# Patient Record
Sex: Female | Born: 1990 | Race: Black or African American | Hispanic: No | Marital: Single | Smoking: Never smoker
Health system: Southern US, Community
[De-identification: ages and names within clinical notes are randomized; demographics above are authoritative.]

## PROBLEM LIST (undated history)

## (undated) DIAGNOSIS — G2581 Restless legs syndrome: Secondary | ICD-10-CM

## (undated) DIAGNOSIS — J45909 Unspecified asthma, uncomplicated: Secondary | ICD-10-CM

## (undated) DIAGNOSIS — O009 Unspecified ectopic pregnancy without intrauterine pregnancy: Secondary | ICD-10-CM

## (undated) DIAGNOSIS — O99019 Anemia complicating pregnancy, unspecified trimester: Secondary | ICD-10-CM

## (undated) DIAGNOSIS — R102 Pelvic and perineal pain: Secondary | ICD-10-CM

## (undated) DIAGNOSIS — R87619 Unspecified abnormal cytological findings in specimens from cervix uteri: Secondary | ICD-10-CM

## (undated) HISTORY — DX: Unspecified abnormal cytological findings in specimens from cervix uteri: R87.619

## (undated) HISTORY — DX: Pelvic and perineal pain: R10.2

## (undated) HISTORY — DX: Anemia complicating pregnancy, unspecified trimester: O99.019

## (undated) HISTORY — PX: HERNIA REPAIR: SHX51

## (undated) HISTORY — DX: Restless legs syndrome: G25.81

---
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
## 2010-05-28 ENCOUNTER — Ambulatory Visit: Payer: Self-pay | Admitting: Unknown Physician Specialty

## 2010-08-31 ENCOUNTER — Ambulatory Visit: Payer: Self-pay | Admitting: Internal Medicine

## 2010-12-15 NOTE — Op Note (Signed)
NAMEVONCILE, Emily Levy            ACCOUNT NO.:  [id number]   MEDICAL RECORD NO.:  [id number]          PATIENT TYPE:  AMB   LOCATION:  SDS                          FACILITY:  [facility]   PHYSICIAN:  Jon Gills, M.D.  DATE OF BIRTH:  1990-11-20   DATE OF PROCEDURE:  03/06/2007  DATE OF DISCHARGE:  02/07/2007                               OPERATIVE REPORT   PREOPERATIVE DIAGNOSIS:  Abdominal pain.   POSTOPERATIVE DIAGNOSIS:  Abdominal pain.   OPERATION:  Upper GI endoscopy with biopsy.   SURGEON:  Jon Gills, MD   ASSISTANT:  None.   DESCRIPTION OF FINDINGS:  Following informed written consent, the  patient was taken to the operating room and placed under general  anesthesia with continuous cardiopulmonary monitoring.  She was placed  under general anesthesia with continuous cardiopulmonary monitoring.  She remained in the supine position and the Pentax upper GI endoscope  was passed by mouth and advanced without difficulty.  A competent lower  esophageal sphincter was present 40 cm from the incisors.  There was no  visual evidence for esophagitis, gastritis, duodenitis or peptic ulcer  disease.  A solitary gastric biopsy was negative for Helicobacter by CLO  testing.  Multiple esophageal, gastric and duodenal biopsies were  unremarkable except for mild chronic gastritis.  The endoscope was  gradually withdrawn.  The patient was awakened, taken recovery room in  satisfactory condition.  She will be released later today to the care of  her family.   DESCRIPTION OF TECHNICAL PROCEDURE USED:  Pentax upper GI endoscope with  cold biopsy forceps.   DESCRIPTION OF SPECIMENS REMOVED:  Esophagus x3 in formalin, gastric x1  for CLO testing, gastric x3 in formalin and duodenum x3 in formalin.           ______________________________  Jon Gills, M.D.     JHC/MEDQ  D:  03/06/2007  T:  03/07/2007  Job:  [id number]   cc:   Serita Grit

## 2011-05-15 LAB — CBC
Hemoglobin: 11.8 — ABNORMAL LOW
MCHC: 33.2
RBC: 4.26
WBC: 5.8

## 2011-05-15 LAB — CLOTEST (H. PYLORI), BIOPSY: Helicobacter screen: NEGATIVE

## 2012-05-05 ENCOUNTER — Emergency Department: Payer: Self-pay | Admitting: Emergency Medicine

## 2012-11-07 ENCOUNTER — Emergency Department: Payer: Self-pay | Admitting: Emergency Medicine

## 2012-11-07 LAB — URINALYSIS, COMPLETE
Bilirubin,UR: NEGATIVE
Blood: NEGATIVE
Ketone: NEGATIVE
Nitrite: NEGATIVE
Ph: 5 (ref 4.5–8.0)
Protein: NEGATIVE
Squamous Epithelial: 1
WBC UR: 1 /HPF (ref 0–5)

## 2012-11-07 LAB — COMPREHENSIVE METABOLIC PANEL
Albumin: 3.9 g/dL (ref 3.4–5.0)
BUN: 9 mg/dL (ref 7–18)
Bilirubin,Total: 0.5 mg/dL (ref 0.2–1.0)
Calcium, Total: 8.7 mg/dL (ref 8.5–10.1)
Co2: 23 mmol/L (ref 21–32)
Creatinine: 0.89 mg/dL (ref 0.60–1.30)
EGFR (African American): >60
EGFR (Non-African Amer.): >60
Osmolality: 279 (ref 275–301)
SGOT(AST): 29 U/L (ref 15–37)
SGPT (ALT): 20 U/L (ref 12–78)
Total Protein: 7.5 g/dL (ref 6.4–8.2)

## 2012-11-07 LAB — CBC
HCT: 37.3 % (ref 35.0–47.0)
HGB: 12.3 g/dL (ref 12.0–16.0)
MCHC: 33 g/dL (ref 32.0–36.0)
Platelet: 275 10*3/uL (ref 150–440)
RBC: 4.55 10*6/uL (ref 3.80–5.20)
RDW: 14.4 % (ref 11.5–14.5)

## 2012-12-10 ENCOUNTER — Inpatient Hospital Stay: Payer: Self-pay | Admitting: Surgery

## 2013-10-27 ENCOUNTER — Ambulatory Visit: Payer: Self-pay | Admitting: Emergency Medicine

## 2014-02-01 ENCOUNTER — Ambulatory Visit: Payer: Self-pay | Admitting: Physician Assistant

## 2014-03-17 ENCOUNTER — Ambulatory Visit: Payer: Self-pay | Admitting: Physician Assistant

## 2014-03-17 LAB — URINALYSIS, COMPLETE
BILIRUBIN, UR: NEGATIVE
Bacteria: NEGATIVE
Blood: NEGATIVE
GLUCOSE, UR: NEGATIVE
Ketone: NEGATIVE
LEUKOCYTE ESTERASE: NEGATIVE
Nitrite: NEGATIVE
PH: 7 (ref 5.0–8.0)
PROTEIN: NEGATIVE
SPECIFIC GRAVITY: 1.01 (ref 1.000–1.030)

## 2014-03-17 LAB — CBC WITH DIFFERENTIAL/PLATELET
BASOS ABS: 0.1 10*3/uL (ref 0.0–0.1)
BASOS PCT: 0.6 %
EOS ABS: 0.1 10*3/uL (ref 0.0–0.7)
Eosinophil %: 1.2 %
HCT: 37.5 % (ref 35.0–47.0)
HGB: 11.5 g/dL — AB (ref 12.0–16.0)
LYMPHS ABS: 2.8 10*3/uL (ref 1.0–3.6)
LYMPHS PCT: 31.5 %
MCH: 23.9 pg — AB (ref 26.0–34.0)
MCHC: 30.7 g/dL — AB (ref 32.0–36.0)
MCV: 78 fL — AB (ref 80–100)
MONO ABS: 0.7 x10 3/mm (ref 0.2–0.9)
MONOS PCT: 8 %
NEUTROS ABS: 5.3 10*3/uL (ref 1.4–6.5)
NEUTROS PCT: 58.7 %
PLATELETS: 318 10*3/uL (ref 150–440)
RBC: 4.82 10*6/uL (ref 3.80–5.20)
RDW: 14.8 % — ABNORMAL HIGH (ref 11.5–14.5)
WBC: 9 10*3/uL (ref 3.6–11.0)

## 2014-03-17 LAB — PREGNANCY, URINE: Pregnancy Test, Urine: NEGATIVE m[IU]/mL

## 2014-04-20 ENCOUNTER — Emergency Department: Payer: Self-pay | Admitting: Emergency Medicine

## 2014-06-07 ENCOUNTER — Emergency Department: Payer: Self-pay | Admitting: Emergency Medicine

## 2014-06-24 ENCOUNTER — Emergency Department: Payer: Self-pay | Admitting: Emergency Medicine

## 2014-07-30 HISTORY — PX: APPENDECTOMY: SHX54

## 2014-09-10 ENCOUNTER — Emergency Department: Payer: Self-pay | Admitting: Emergency Medicine

## 2014-10-13 ENCOUNTER — Emergency Department: Payer: Self-pay | Admitting: Emergency Medicine

## 2014-11-19 NOTE — Op Note (Signed)
PATIENT NAME:  Emily Levy, POLSKY MR#:  [id number] DATE OF BIRTH:  1991/05/12  DATE OF PROCEDURE:  12/09/2012  PREOPERATIVE DIAGNOSIS: Umbilical hernia.   POSTOPERATIVE DIAGNOSIS: Umbilical hernia, 2 x 2 centimeters.   PROCEDURE PERFORMED: Umbilical hernia repair with Ventralex Patch, approximately 6 cm.   SURGEON: Jurni Cesaro A. Anvi Mangal, MD   ESTIMATED BLOOD LOSS: 10 mL.    COMPLICATIONS: None.   ANESTHESIA: General.   INDICATION FOR SURGERY: [name] is a pleasant 24 year old female who presented with a tender periumbilical hernia. I offered her repair, and thus she was brought into the operating room  for repair of umbilical hernia.   DETAILS OF PROCEDURE: Informed consent was obtained from [name]. She was brought to the operating room suite. She was laid supine on the operating room table. She was induced. An endotracheal tube was placed.  General anesthesia was administered. Her abdomen was then prepped and draped in standard surgical fashion. A timeout was then performed correctly identifying the patient name, operative site and procedure to be performed. An infraumbilical incision was made. This was deepened down to the fascia. The fascia was encountered, and I proceeded to put a Kelly clamp around her umbilical stump.  Once I was around, I transected the umbilical stump, and the defect was well dissected out.  I then cleared off the surrounding fascia and measured the defect to be 2 x 2 cm. I then used a medium Ventralex Patch which I placed underneath and incorporated into the closure. I used 0 Ethibond interrupted transverse-oriented  sutures taking a bite out of  the tails of the Ventralex Patch. There were approximately 8 sutures necessary. These were all tied at the end after all were placed. The closure was very secure, and the mesh was incorporated while the umbilicus was then tacked to the repair using two 4-0 Vicryl interrupted sutures tacking the underside of the  umbilicus to the repair. The wound was then irrigated.  It was then closed with a deep dermal interrupted 3-0 Vicryl suture and closed with a subcuticular 4-0 Monocryl. Steri-Strips, Telfa gauze and Tegaderm were then placed over the wound. The patient was then awoken, extubated and brought to the postanesthesia care unit. There were no immediate complications. Needle, sponge, and instrument counts were correct at the end of the procedure.   ____________________________ Glena Norfolk. Leolia Vinzant, MD cal:cb D: 12/09/2012 20:16:54 ET T: 12/09/2012 20:41:41 ET JOB#: [id number]  cc: Harrell Gave A. Ailey Wessling, MD, <Dictator> Floyde Parkins MD ELECTRONICALLY SIGNED 12/10/2012 15:39

## 2015-01-01 ENCOUNTER — Emergency Department
Admission: EM | Admit: 2015-01-01 | Discharge: 2015-01-01 | Disposition: A | Discharge status: Home / Self Care | Payer: Self-pay | Attending: Emergency Medicine | Admitting: Emergency Medicine

## 2015-01-01 ENCOUNTER — Encounter: Payer: Self-pay | Admitting: *Deleted

## 2015-01-01 DIAGNOSIS — D1721 Benign lipomatous neoplasm of skin and subcutaneous tissue of right arm: Secondary | ICD-10-CM | POA: Insufficient documentation

## 2015-01-01 DIAGNOSIS — S40861A Insect bite (nonvenomous) of right upper arm, initial encounter: Secondary | ICD-10-CM | POA: Insufficient documentation

## 2015-01-01 DIAGNOSIS — Y998 Other external cause status: Secondary | ICD-10-CM | POA: Insufficient documentation

## 2015-01-01 DIAGNOSIS — S70362A Insect bite (nonvenomous), left thigh, initial encounter: Secondary | ICD-10-CM | POA: Insufficient documentation

## 2015-01-01 DIAGNOSIS — S30860A Insect bite (nonvenomous) of lower back and pelvis, initial encounter: Secondary | ICD-10-CM | POA: Insufficient documentation

## 2015-01-01 DIAGNOSIS — S70361A Insect bite (nonvenomous), right thigh, initial encounter: Secondary | ICD-10-CM | POA: Insufficient documentation

## 2015-01-01 DIAGNOSIS — Y9389 Activity, other specified: Secondary | ICD-10-CM | POA: Insufficient documentation

## 2015-01-01 DIAGNOSIS — Y9289 Other specified places as the place of occurrence of the external cause: Secondary | ICD-10-CM | POA: Insufficient documentation

## 2015-01-01 DIAGNOSIS — D1720 Benign lipomatous neoplasm of skin and subcutaneous tissue of unspecified limb: Secondary | ICD-10-CM

## 2015-01-01 DIAGNOSIS — W57XXXA Bitten or stung by nonvenomous insect and other nonvenomous arthropods, initial encounter: Secondary | ICD-10-CM | POA: Insufficient documentation

## 2015-01-01 MED ORDER — HYDROXYZINE HCL 50 MG PO TABS
ORAL_TABLET | ORAL | Status: AC
  Administered 2015-01-01: 50 mg via ORAL
  Filled 2015-01-01: qty 1

## 2015-01-01 MED ORDER — DEXAMETHASONE SODIUM PHOSPHATE 10 MG/ML IJ SOLN
INTRAMUSCULAR | Status: AC
  Administered 2015-01-01: 10 mg via INTRAMUSCULAR
  Filled 2015-01-01: qty 1

## 2015-01-01 MED ORDER — HYDROXYZINE HCL 50 MG PO TABS: 50.0000 mg | ORAL_TABLET | Freq: Three times a day (TID) | ORAL | Status: DC | PRN

## 2015-01-01 MED ORDER — HYDROXYZINE HCL 50 MG PO TABS
50.0000 mg | ORAL_TABLET | Freq: Once | ORAL | Status: AC
  Administered 2015-01-01: 50 mg via ORAL

## 2015-01-01 MED ORDER — METHYLPREDNISOLONE 4 MG PO TBPK: ORAL_TABLET | ORAL | Status: DC

## 2015-01-01 MED ORDER — DEXAMETHASONE SODIUM PHOSPHATE 10 MG/ML IJ SOLN
10.0000 mg | Freq: Once | INTRAMUSCULAR | Status: AC
  Administered 2015-01-01: 10 mg via INTRAMUSCULAR

## 2015-01-01 NOTE — ED Notes (Signed)
Pt presents w/ multiple insect bites, states they are from mosquitoes. Pt denies relief from topical OTC itching medication.

## 2015-01-01 NOTE — ED Notes (Signed)
Patient states she has bites on her right arm, buttocks and back of her thighs.  She also has a sore area on right upper near [facility] that is hard and painful to touch but no redness noted. Benadryl itch cream used and PO Benadryl at 7am this morning. No relief.  States she thinks the mosquito was in the house, denies being outside in wooded areas.

## 2015-01-01 NOTE — ED Provider Notes (Signed)
[facility] Emergency Department Provider Note ___________________________________________  Time seen: Approximately 8:40 PM  I have reviewed the triage vital signs and the nursing notes.   HISTORY  Chief Complaint Insect Bite    HPI Emily Levy is a 24 y.o. female and plan a multiple insect bites to the right arm buttocks and back thighs. He is also concern about a nausea lesion is. Antecubital area of the right arm. Patient denies any fevers chills associated with thisand signs of secondary infection. Patient's the incident occurred the day of visit insect bites. She denies any nausea vomiting diarrhea. Denies any other reactions to insect bites. Patient denies pain with see her discomfort as a 6/10.   History reviewed. No pertinent past medical history.  There are no active problems to display for this patient.   Past Surgical History  Procedure Laterality Date  . Hernia repair      Current Outpatient Rx  Name  Route  Sig  Dispense  Refill  . pramipexole (MIRAPEX) 0.25 MG tablet   Oral   Take 0.25 mg by mouth 3 (three) times daily.         . hydrOXYzine (ATARAX/VISTARIL) 50 MG tablet   Oral   Take 1 tablet (50 mg total) by mouth 3 (three) times daily as needed.   30 tablet   0   . methylPREDNISolone (MEDROL DOSEPAK) 4 MG TBPK tablet      Take Tapered dose as directed   21 tablet   0     Allergies Sulfa antibiotics  History reviewed. No pertinent family history.  Social History History  Substance Use Topics  . Smoking status: Never Smoker   . Smokeless tobacco: Never Used  . Alcohol Use: No    Review of Systems Constitutional: No fever/chills Eyes: No visual changes. ENT: No sore throat. Cardiovascular: Denies chest pain. Respiratory: Denies shortness of breath. Gastrointestinal: No abdominal pain.  No nausea, no vomiting.  No diarrhea.  No constipation. Genitourinary: Negative for dysuria. Musculoskeletal:  Negative for back pain. Skin: Rash secondary to insect bites. Nausea lesion right upper arm. Neurological: Negative for headaches, focal weakness or numbness.  10-point ROS otherwise negative.  ____________________________________________   PHYSICAL EXAM:  VITAL SIGNS: ED Triage Vitals  Enc Vitals Group     BP 01/01/15 1943 122/78 mmHg     Pulse Rate 01/01/15 1943 91     Resp 01/01/15 1943 20     Temp 01/01/15 1943 98.4 F (36.9 C)     Temp Source 01/01/15 1943 Oral     SpO2 01/01/15 1943 100 %     Weight 01/01/15 1943 212 lb 9.6 oz (96.435 kg)     Height 01/01/15 1943 5\' 5"  (1.651 m)     Head Cir --      Peak Flow --      Pain Score 01/01/15 1944 6     Pain Loc --      Pain Edu? --      Excl. in [place]? --    Constitutional: Alert and oriented. Well appearing and in no acute distress. Eyes: Conjunctivae are normal. PERRL. EOMI. Head: Atraumatic. Nose: No congestion/rhinnorhea. Mouth/Throat: Mucous membranes are moist.  Oropharynx non-erythematous. Neck: No stridor.  No deformity for nuchal range of motion nontender palpation. Hematological/Lymphatic/Immunilogical: No cervical lymphadenopathy. Cardiovascular: Normal rate, regular rhythm. Grossly normal heart sounds.  Good peripheral circulation. Respiratory: Normal respiratory effort.  No retractions. Lungs CTAB. Gastrointestinal: Soft and nontender. No distention. No abdominal bruits.  No CVA tenderness. Musculoskeletal: No lower extremity tenderness nor edema.  No joint effusions. Neurologic:  Normal speech and language. No gross focal neurologic deficits are appreciated. Speech is normal. No gait instability. Skin: Erythematous macular lesions on the right arm buttocks in the back of her thighs. There is a mobile nausea lesion right upper arm. The lesion the right upper arm is nontender palpation. Psychiatric: Mood and affect are normal. Speech and behavior are normal.  ____________________________________________    LABS (all labs ordered are listed, but only abnormal results are displayed)  Labs Reviewed - No data to display ____________________________________________  EKG   ____________________________________________  RADIOLOGY   ____________________________________________   PROCEDURES  Procedure(s) performed: None  Critical Care performed: No  ____________________________________________   INITIAL IMPRESSION / ASSESSMENT AND PLAN / ED COURSE  Pertinent labs & imaging results that were available during my care of the patient were reviewed by me and considered in my medical decision making (see chart for details).  Rash secondary to insect bite. Lipoma  ____________________________________________   FINAL CLINICAL IMPRESSION(S) / ED DIAGNOSES  Final diagnoses:  Insect bites  Lipoma of arm      Sable Feil, PA-C 01/01/15 2051  Nance Pear, MD 01/01/15 2144

## 2015-01-01 NOTE — ED Notes (Signed)
Patient with no complaints at this time. Respirations even and unlabored. Skin warm/dry. Discharge instructions reviewed with patient at this time. Patient given opportunity to voice concerns/ask questions. Patient discharged at this time and left Emergency Department with steady gait.   

## 2015-09-12 ENCOUNTER — Emergency Department: Payer: Self-pay

## 2015-09-12 ENCOUNTER — Encounter: Payer: Self-pay | Admitting: *Deleted

## 2015-09-12 ENCOUNTER — Observation Stay
Admission: EM | Admit: 2015-09-12 | Discharge: 2015-09-13 | Disposition: A | Discharge status: Home / Self Care | Payer: Self-pay | Attending: Internal Medicine | Admitting: Internal Medicine

## 2015-09-12 DIAGNOSIS — R490 Dysphonia: Secondary | ICD-10-CM | POA: Insufficient documentation

## 2015-09-12 DIAGNOSIS — R079 Chest pain, unspecified: Secondary | ICD-10-CM

## 2015-09-12 DIAGNOSIS — T7840XA Allergy, unspecified, initial encounter: Secondary | ICD-10-CM | POA: Insufficient documentation

## 2015-09-12 DIAGNOSIS — R05 Cough: Secondary | ICD-10-CM | POA: Insufficient documentation

## 2015-09-12 DIAGNOSIS — R509 Fever, unspecified: Secondary | ICD-10-CM | POA: Insufficient documentation

## 2015-09-12 DIAGNOSIS — Y9389 Activity, other specified: Secondary | ICD-10-CM | POA: Insufficient documentation

## 2015-09-12 DIAGNOSIS — I071 Rheumatic tricuspid insufficiency: Secondary | ICD-10-CM | POA: Insufficient documentation

## 2015-09-12 DIAGNOSIS — I340 Nonrheumatic mitral (valve) insufficiency: Secondary | ICD-10-CM | POA: Insufficient documentation

## 2015-09-12 DIAGNOSIS — Z882 Allergy status to sulfonamides status: Secondary | ICD-10-CM | POA: Insufficient documentation

## 2015-09-12 DIAGNOSIS — R0789 Other chest pain: Principal | ICD-10-CM | POA: Insufficient documentation

## 2015-09-12 DIAGNOSIS — X58XXXA Exposure to other specified factors, initial encounter: Secondary | ICD-10-CM | POA: Insufficient documentation

## 2015-09-12 DIAGNOSIS — J029 Acute pharyngitis, unspecified: Secondary | ICD-10-CM | POA: Insufficient documentation

## 2015-09-12 DIAGNOSIS — R000 Tachycardia, unspecified: Secondary | ICD-10-CM | POA: Diagnosis present

## 2015-09-12 DIAGNOSIS — F419 Anxiety disorder, unspecified: Secondary | ICD-10-CM | POA: Insufficient documentation

## 2015-09-12 DIAGNOSIS — Z79899 Other long term (current) drug therapy: Secondary | ICD-10-CM | POA: Insufficient documentation

## 2015-09-12 DIAGNOSIS — R21 Rash and other nonspecific skin eruption: Secondary | ICD-10-CM | POA: Insufficient documentation

## 2015-09-12 LAB — COMPREHENSIVE METABOLIC PANEL
ALBUMIN: 4 g/dL (ref 3.5–5.0)
ALK PHOS: 59 U/L (ref 38–126)
ALT: 12 U/L — ABNORMAL LOW (ref 14–54)
ANION GAP: 8 (ref 5–15)
AST: 17 U/L (ref 15–41)
BILIRUBIN TOTAL: 0.7 mg/dL (ref 0.3–1.2)
BUN: 9 mg/dL (ref 6–20)
CALCIUM: 9.1 mg/dL (ref 8.9–10.3)
CO2: 25 mmol/L (ref 22–32)
Chloride: 104 mmol/L (ref 101–111)
Creatinine, Ser: 0.89 mg/dL (ref 0.44–1.00)
GFR calc Af Amer: >60 mL/min (ref >60)
GFR calc non Af Amer: >60 mL/min (ref >60)
GLUCOSE: 89 mg/dL (ref 65–99)
Potassium: 4 mmol/L (ref 3.5–5.1)
Sodium: 137 mmol/L (ref 135–145)
TOTAL PROTEIN: 8.2 g/dL — AB (ref 6.5–8.1)

## 2015-09-12 LAB — CBC WITH DIFFERENTIAL/PLATELET
BASOS PCT: 0 %
Basophils Absolute: 0 10*3/uL (ref 0–0.1)
Eosinophils Absolute: 0.1 10*3/uL (ref 0–0.7)
Eosinophils Relative: 1 %
HEMATOCRIT: 38.2 % (ref 35.0–47.0)
HEMOGLOBIN: 12.2 g/dL (ref 12.0–16.0)
LYMPHS ABS: 0.7 10*3/uL — AB (ref 1.0–3.6)
LYMPHS PCT: 7 %
MCH: 24.6 pg — ABNORMAL LOW (ref 26.0–34.0)
MCHC: 32 g/dL (ref 32.0–36.0)
MCV: 76.8 fL — ABNORMAL LOW (ref 80.0–100.0)
MONOS PCT: 8 %
Monocytes Absolute: 0.7 10*3/uL (ref 0.2–0.9)
NEUTROS ABS: 7.9 10*3/uL — AB (ref 1.4–6.5)
NEUTROS PCT: 84 %
Platelets: 292 10*3/uL (ref 150–440)
RBC: 4.96 MIL/uL (ref 3.80–5.20)
RDW: 15 % — ABNORMAL HIGH (ref 11.5–14.5)
WBC: 9.4 10*3/uL (ref 3.6–11.0)

## 2015-09-12 LAB — URINE DRUG SCREEN, QUALITATIVE (ARMC ONLY)
AMPHETAMINES, UR SCREEN: NOT DETECTED
BENZODIAZEPINE, UR SCRN: NOT DETECTED
Barbiturates, Ur Screen: NOT DETECTED
COCAINE METABOLITE, UR ~~LOC~~: NOT DETECTED
Cannabinoid 50 Ng, Ur ~~LOC~~: NOT DETECTED
MDMA (ECSTASY) UR SCREEN: NOT DETECTED
METHADONE SCREEN, URINE: NOT DETECTED
OPIATE, UR SCREEN: NOT DETECTED
PHENCYCLIDINE (PCP) UR S: NOT DETECTED
Tricyclic, Ur Screen: NOT DETECTED

## 2015-09-12 LAB — URINALYSIS COMPLETE WITH MICROSCOPIC (ARMC ONLY)
Bacteria, UA: NONE SEEN
Bilirubin Urine: NEGATIVE
Glucose, UA: NEGATIVE mg/dL
KETONES UR: NEGATIVE mg/dL
Leukocytes, UA: NEGATIVE
Nitrite: NEGATIVE
PH: 6 (ref 5.0–8.0)
PROTEIN: NEGATIVE mg/dL
SPECIFIC GRAVITY, URINE: 1.01 (ref 1.005–1.030)

## 2015-09-12 LAB — BRAIN NATRIURETIC PEPTIDE: B Natriuretic Peptide: 17 pg/mL (ref 0.0–100.0)

## 2015-09-12 LAB — RAPID INFLUENZA A&B ANTIGENS
Influenza A (ARMC): NEGATIVE
Influenza B (ARMC): NEGATIVE

## 2015-09-12 LAB — LACTIC ACID, PLASMA
Lactic Acid, Venous: 0.8 mmol/L (ref 0.5–2.0)
Lactic Acid, Venous: 1.1 mmol/L (ref 0.5–2.0)

## 2015-09-12 LAB — POCT PREGNANCY, URINE: Preg Test, Ur: NEGATIVE

## 2015-09-12 LAB — LIPASE, BLOOD: Lipase: 27 U/L (ref 11–51)

## 2015-09-12 LAB — TROPONIN I

## 2015-09-12 LAB — TSH: TSH: 0.897 u[IU]/mL (ref 0.350–4.500)

## 2015-09-12 LAB — POCT RAPID STREP A: Streptococcus, Group A Screen (Direct): NEGATIVE

## 2015-09-12 LAB — FIBRIN DERIVATIVES D-DIMER (ARMC ONLY): Fibrin derivatives D-dimer (ARMC): 1147 — ABNORMAL HIGH (ref 0–499)

## 2015-09-12 LAB — T4, FREE: Free T4: 0.7 ng/dL (ref 0.61–1.12)

## 2015-09-12 MED ORDER — SODIUM CHLORIDE 0.9% FLUSH
3.0000 mL | Freq: Two times a day (BID) | INTRAVENOUS | Status: DC
  Administered 2015-09-12: 3 mL via INTRAVENOUS

## 2015-09-12 MED ORDER — SODIUM CHLORIDE 0.9 % IV BOLUS (SEPSIS)
1000.0000 mL | Freq: Once | INTRAVENOUS | Status: AC
  Administered 2015-09-12: 1000 mL via INTRAVENOUS

## 2015-09-12 MED ORDER — MORPHINE SULFATE (PF) 4 MG/ML IV SOLN
4.0000 mg | Freq: Once | INTRAVENOUS | Status: AC
  Administered 2015-09-12: 4 mg via INTRAVENOUS
  Filled 2015-09-12: qty 1

## 2015-09-12 MED ORDER — IBUPROFEN 800 MG PO TABS
800.0000 mg | ORAL_TABLET | ORAL | Status: AC
  Administered 2015-09-12: 800 mg via ORAL
  Filled 2015-09-12: qty 1

## 2015-09-12 MED ORDER — MEDROXYPROGESTERONE ACETATE 150 MG/ML IM SUSP: 150.0000 mg | INTRAMUSCULAR | Status: DC

## 2015-09-12 MED ORDER — MORPHINE SULFATE (PF) 4 MG/ML IV SOLN
4.0000 mg | Freq: Once | INTRAVENOUS | Status: AC
Start: 2015-09-12 — End: 2015-09-12
  Administered 2015-09-12: 4 mg via INTRAVENOUS
  Filled 2015-09-12: qty 1

## 2015-09-12 MED ORDER — SODIUM CHLORIDE 0.9 % IV SOLN
INTRAVENOUS | Status: AC
  Administered 2015-09-12 – 2015-09-13 (×2): via INTRAVENOUS

## 2015-09-12 MED ORDER — LORATADINE 10 MG PO TABS
10.0000 mg | ORAL_TABLET | Freq: Every day | ORAL | Status: DC
  Administered 2015-09-12 – 2015-09-13 (×2): 10 mg via ORAL
  Filled 2015-09-12 (×2): qty 1

## 2015-09-12 MED ORDER — PRAMIPEXOLE DIHYDROCHLORIDE 0.25 MG PO TABS
0.2500 mg | ORAL_TABLET | Freq: Three times a day (TID) | ORAL | Status: DC
  Administered 2015-09-12 – 2015-09-13 (×2): 0.25 mg via ORAL
  Filled 2015-09-12 (×2): qty 1

## 2015-09-12 MED ORDER — DIPHENHYDRAMINE HCL 25 MG PO CAPS
25.0000 mg | ORAL_CAPSULE | Freq: Four times a day (QID) | ORAL | Status: DC | PRN
  Administered 2015-09-13: 25 mg via ORAL
  Filled 2015-09-12: qty 1

## 2015-09-12 MED ORDER — IOHEXOL 350 MG/ML SOLN
75.0000 mL | Freq: Once | INTRAVENOUS | Status: AC | PRN
  Administered 2015-09-12: 75 mL via INTRAVENOUS

## 2015-09-12 MED ORDER — ONDANSETRON HCL 4 MG PO TABS: 4.0000 mg | ORAL_TABLET | Freq: Four times a day (QID) | ORAL | Status: DC | PRN

## 2015-09-12 MED ORDER — FAMOTIDINE 20 MG PO TABS
20.0000 mg | ORAL_TABLET | Freq: Two times a day (BID) | ORAL | Status: DC
  Administered 2015-09-12 – 2015-09-13 (×2): 20 mg via ORAL
  Filled 2015-09-12 (×2): qty 1

## 2015-09-12 MED ORDER — ONDANSETRON HCL 4 MG/2ML IJ SOLN: 4.0000 mg | Freq: Four times a day (QID) | INTRAMUSCULAR | Status: DC | PRN

## 2015-09-12 MED ORDER — ACETAMINOPHEN 325 MG PO TABS
650.0000 mg | ORAL_TABLET | Freq: Four times a day (QID) | ORAL | Status: DC | PRN
  Administered 2015-09-12: 650 mg via ORAL
  Filled 2015-09-12: qty 2

## 2015-09-12 MED ORDER — ALPRAZOLAM 0.25 MG PO TABS
0.2500 mg | ORAL_TABLET | Freq: Once | ORAL | Status: AC
  Administered 2015-09-12: 0.25 mg via ORAL
  Filled 2015-09-12: qty 1

## 2015-09-12 MED ORDER — IBUPROFEN 400 MG PO TABS
400.0000 mg | ORAL_TABLET | Freq: Four times a day (QID) | ORAL | Status: DC | PRN
  Administered 2015-09-13: 400 mg via ORAL
  Filled 2015-09-12: qty 1

## 2015-09-12 MED ORDER — ACETAMINOPHEN 500 MG PO TABS
1000.0000 mg | ORAL_TABLET | ORAL | Status: AC
  Administered 2015-09-12: 1000 mg via ORAL
  Filled 2015-09-12: qty 2

## 2015-09-12 MED ORDER — ACETAMINOPHEN 650 MG RE SUPP: 650.0000 mg | Freq: Four times a day (QID) | RECTAL | Status: DC | PRN

## 2015-09-12 NOTE — ED Notes (Addendum)
States she believes she is having a allergic reaction, pt is not sure what the reaction is too, states she drank some strawberry lemonade yesterday and that she believes that might be it, pt speaking in full complete sentances, no resp distress, states her throat is itching, pt has been taking benadryl at home

## 2015-09-12 NOTE — ED Provider Notes (Signed)
[facility] Emergency Department Provider Note  ____________________________________________  Time seen: Approximately 1:28 PM  I have reviewed the triage vital signs and the nursing notes.   HISTORY  Chief Complaint Allergic Reaction    HPI Emily Levy is a 25 y.o. female presents for evaluation of scratchiness in her throat. She reports started yesterday after drinking lemonade and feels a scratchy sensation, chills, generalized weakness and fatigue. She does not feel short of breath but does have a dry cough. She is taking 1-2 Benadryl tabletsevery 6 hours without relief.  Denies leg swelling. She has felt warm as though she did have a slight fever. Denies headache, neck pain.  Does report a slight discomfort and occasional sharp pain in the front of her chest.  History reviewed. No pertinent past medical history.  There are no active problems to display for this patient.   Past Surgical History  Procedure Laterality Date  . Hernia repair      Current Outpatient Rx  Name  Route  Sig  Dispense  Refill  . hydrOXYzine (ATARAX/VISTARIL) 50 MG tablet   Oral   Take 1 tablet (50 mg total) by mouth 3 (three) times daily as needed.   30 tablet   0   . methylPREDNISolone (MEDROL DOSEPAK) 4 MG TBPK tablet      Take Tapered dose as directed   21 tablet   0   . pramipexole (MIRAPEX) 0.25 MG tablet   Oral   Take 0.25 mg by mouth 3 (three) times daily.           Allergies Sulfa antibiotics  History reviewed. No pertinent family history.  Social History Social History  Substance Use Topics  . Smoking status: Never Smoker   . Smokeless tobacco: Never Used  . Alcohol Use: No    Review of Systems Constitutional: See history of present illness Eyes: No visual changes. ENT: No sore throat. Cardiovascular: See history of present illness. Respiratory: Denies shortness of breath. Dry cough. Gastrointestinal: No abdominal pain.  No  nausea, no vomiting.  No diarrhea.  No constipation. Genitourinary: Negative for dysuria. Musculoskeletal: Negative for back pain. Skin: Negative for rash. Neurological: Negative for headaches, focal weakness or numbness.  10-point ROS otherwise negative.  ____________________________________________   PHYSICAL EXAM:  VITAL SIGNS: ED Triage Vitals  Enc Vitals Group     BP 09/12/15 1126 124/81 mmHg     Pulse Rate 09/12/15 1126 120     Resp 09/12/15 1126 18     Temp 09/12/15 1126 99.5 F (37.5 C)     Temp Source 09/12/15 1126 Oral     SpO2 09/12/15 1126 96 %     Weight 09/12/15 1126 210 lb (95.255 kg)     Height 09/12/15 1126 5\' 6"  (1.676 m)     Head Cir --      Peak Flow --      Pain Score 09/12/15 1126 3     Pain Loc --      Pain Edu? --      Excl. in [place]? --    Constitutional: Alert and oriented. Patient does appear fatigued.  Eyes: Conjunctivae are normal. PERRL. EOMI. Head: Atraumatic. Nose: No congestion/rhinnorhea. Mouth/Throat: Mucous membranes are dry.  Oropharynx non-erythematous though slightly injected. No evidence of pharyngeal mass or abscess. Tonsillar slightly hypertrophied bilaterally without exudates. Neck: No stridor.  No anterior neck swelling. Cardiovascular: Tachycardic rate, regular rhythm. Grossly normal heart sounds.  Good peripheral circulation. Respiratory: Slight tachypnea but  otherwise normal respiratory effort.  No retractions. Lungs CTAB. Gastrointestinal: Soft and nontender. No distention. Musculoskeletal: No lower extremity tenderness nor edema.  No joint effusions. Neurologic:  Normal speech and language. No gross focal neurologic deficits are appreciated. Skin:  Skin is warm, dry and intact. No rash noted. Psychiatric: Mood and affect are normal. Speech and behavior are normal.  ____________________________________________   LABS (all labs ordered are listed, but only abnormal results are displayed)  Labs Reviewed  CBC WITH  DIFFERENTIAL/PLATELET - Abnormal; Notable for the following:    MCV 76.8 (*)    MCH 24.6 (*)    RDW 15.0 (*)    Neutro Abs 7.9 (*)    Lymphs Abs 0.7 (*)    All other components within normal limits  COMPREHENSIVE METABOLIC PANEL - Abnormal; Notable for the following:    Total Protein 8.2 (*)    ALT 12 (*)    All other components within normal limits  URINALYSIS COMPLETEWITH MICROSCOPIC ([facility] ONLY) - Abnormal; Notable for the following:    Color, Urine STRAW (*)    APPearance CLEAR (*)    Hgb urine dipstick 1+ (*)    Squamous Epithelial / LPF 0-5 (*)    All other components within normal limits  FIBRIN DERIVATIVES D-DIMER ([facility] ONLY) - Abnormal; Notable for the following:    Fibrin derivatives D-dimer ([facility]) 1147 (*)    All other components within normal limits  RAPID INFLUENZA A&B ANTIGENS ([facility] ONLY)  CULTURE, BLOOD (ROUTINE X 2)  CULTURE, BLOOD (ROUTINE X 2)  TSH  T4, FREE  LIPASE, BLOOD  LACTIC ACID, PLASMA  BRAIN NATRIURETIC PEPTIDE  URINE DRUG SCREEN, QUALITATIVE ([facility] ONLY)  LACTIC ACID, PLASMA  POC URINE PREG, ED  POCT RAPID STREP A  POCT PREGNANCY, URINE   ____________________________________________  EKG  Reviewed and interpreted by me at 1301 Sinus tachycardia Heart rate 130 PR 140 QTc 470 No ischemic T-wave abnormality Reviewed and interpreted as sinus tachycardia ____________________________________________  RADIOLOGY  CT Angio Chest PE W/Cm &/Or Wo Cm (Final result) Result time: 09/12/15 15:48:21   Final result by Rad Results In Interface (09/12/15 15:48:21)   Narrative:   CLINICAL DATA: States she believes she is having a allergic reaction, pt is not sure what the reaction is too, states she drank some strawberry lemonade yesterday and that she believes that might be it, pt speaking in full complete sentances,. Left chest pain.  EXAM: CT ANGIOGRAPHY CHEST WITH CONTRAST  TECHNIQUE: Multidetector CT imaging of the chest was performed using  the standard protocol during bolus administration of intravenous contrast. Multiplanar CT image reconstructions and MIPs were obtained to evaluate the vascular anatomy.  CONTRAST: 64mL OMNIPAQUE IOHEXOL 350 MG/ML SOLN  COMPARISON: None.  FINDINGS: Vascular: Right arm IV contrast injection. The SVC is patent. Right atrium is nondilated. RV/LV ratio is normal. Satisfactory opacification of pulmonary arteries noted, and there is no evidence of pulmonary emboli. Patent bilateral pulmonary veins. Adequate contrast opacification of the thoracic aorta with no evidence of dissection, aneurysm, or stenosis. There is bovine variant brachiocephalic arch anatomy without proximal stenosis.  Mediastinum/Lymph Nodes: No masses or pathologically enlarged lymph nodes identified. No pericardial effusion.  Lungs/Pleura: No pulmonary mass, infiltrate, or effusion. No pneumothorax.  Upper abdomen: No acute findings.  Musculoskeletal: No chest wall mass or suspicious bone lesions identified.  Review of the MIP images confirms the above findings.  IMPRESSION: 1. Negative. No evidence of pulmonary embolus or thoracic dissection.   Electronically Signed By: Lucrezia Europe  M.D. On: 09/12/2015 15:48          DG Chest 2 View (Final result) Result time: 09/12/15 14:42:44   Final result by Rad Results In Interface (09/12/15 14:42:44)   Narrative:   CLINICAL DATA: Chest pain throat itching for 1 day  EXAM: CHEST 2 VIEW  COMPARISON: 08/31/2010  FINDINGS: The heart size and vascular pattern are normal. No consolidation or effusion. Mild perihilar bronchial wall thickening increased from prior study. No significant change in airway caliber when compared to prior study.  IMPRESSION: No active cardiopulmonary disease.   Electronically Signed By: Skipper Cliche M.D. On: 09/12/2015 14:42          DG Neck Soft Tissue (Final result) Result time: 09/12/15 14:40:49    Final result by Rad Results In Interface (09/12/15 14:40:49)   Narrative:   CLINICAL DATA: Chest pain for 1 day. Itching sensation in the throat and hoarseness for 1 day. Initial encounter.  EXAM: NECK SOFT TISSUES - 1+ VIEW  COMPARISON: Plain film cervical spine 06/24/2014.  FINDINGS: There is no evidence of retropharyngeal soft tissue swelling or epiglottic enlargement. The cervical airway is unremarkable and no radio-opaque foreign body identified.  IMPRESSION: Negative exam.   Electronically Signed By: Inge Rise M.D. On: 09/12/2015 14:40    ____________________________________________   PROCEDURES  Procedure(s) performed: None  Critical Care performed: No  ____________________________________________   INITIAL IMPRESSION / ASSESSMENT AND PLAN / ED COURSE  Pertinent labs & imaging results that were available during my care of the patient were reviewed by me and considered in my medical decision making (see chart for details).  Patient presents for a low-grade fever, scratchy sensation in her throat and generalized weakness. Patient reports she feels that she is having "allergic reaction" but has no evidence of hives, angioedema, obvious or new exposure, wheezing or evidence of obvious allergic reaction physical examination her history. She has a low-grade fever and pharyngeal erythema which most suggestive pharyngitis, however she also reports a element of chest discomfort. Based on the patient's tachycardia and chest pain a d-dimer was obtained was demonstrates positivity however CT of the chest has noticed completely normal.  After further evaluation and observation and hydration in the ER, the patient does report her symptoms are improving. Overall this point nothing to suggest acute oropharyngeal or peritonsillar abscess. She does report a previous immunization. Patient is flu negative.  ----------------------------------------- 4:01 PM on  09/12/2015 -----------------------------------------  Patient reports slight improvement. Still notably tachycardic up to as far as 140. She's complete approximately a liter and a half of fluid at this time, plan to give additional fluids. No evidence congestive heart failure. No evidence of acute coronary syndrome, suspect probable infectious etiology still slightly unclear. Continue to follow her closely clinically.  ----------------------------------------- 6:22 PM on 09/12/2015 -----------------------------------------  Patient being admitted. Patient is a persistent tachycardia. Discussed with Dr. [facility] service, and although at this time I have no evidence to support clear bacterial infection or obvious etiology I am concerned about her persistent tachycardia and low-grade fever and tachypnea. I do not believe this is allergic reaction, but rather probable viral etiology. She'll be admitted, blood cultures drawn and followed closely clinically. ____________________________________________   FINAL CLINICAL IMPRESSION(S) / ED DIAGNOSES  Final diagnoses:  Chest pain  Tachycardia with 121 - 140 beats per minute      Delman Kitten, MD 09/12/15 1823

## 2015-09-12 NOTE — H&P (Signed)
[place] [facility] [place] NAME: Emily Levy    MR#:  [id number]  DATE OF BIRTH:  03/12/1991  DATE OF ADMISSION:  09/12/2015  PRIMARY CARE PHYSICIAN: [place]   REQUESTING/REFERRING PHYSICIAN: [facility]  CHIEF COMPLAINT:   Allergic reaction HISTORY OF PRESENT ILLNESS:  Emily Levy  is a 25 y.o. female with a known history of chronic sinus tachycardia is presenting to the ED with a chief complaint of scratchy throat and rash after she drank lemonade. Patient also had a generalized weakness with fatigue and chills. Denies any sick contacts. Patient was given Benadryl after which she started feeling better. Rash is completely resolved during my examination. Patient is reporting left-sided chest pain, CTAof the chest is negative for pulmonary embolism. Patient reports having stress test done a few years ago by cardiology for sinus tachycardia which was normal. ED physician has called hospitalist as patient has persistent tachycardia and heart rate went up to 130s associated with low-grade fever and chest pain. Patient is resting comfortably during my examination. Denies any throat closing sensation or shortness of breath  PAST MEDICAL HISTORY:  Chronic sinus tachycardia  PAST SURGICAL HISTOIRY:   Past Surgical History  Procedure Laterality Date  . Hernia repair      SOCIAL HISTORY:   Social History  Substance Use Topics  . Smoking status: Never Smoker   . Smokeless tobacco: Never Used  . Alcohol Use: No    FAMILY HISTORY:  Denies any hypertension or diabetes mellitus. Denies any heart conditions  DRUG ALLERGIES:   Allergies  Allergen Reactions  . Sulfa Antibiotics Hives    REVIEW OF SYSTEMS:  CONSTITUTIONAL: No fever, fatigue or weakness.  EYES: No blurred or double vision.  EARS, NOSE, AND THROAT: No tinnitus or ear pain.  RESPIRATORY: No cough, shortness of breath, wheezing or hemoptysis.   CARDIOVASCULAR: No chest pain, orthopnea, edema. He has history of chronic sinus tachycardia GASTROINTESTINAL: No nausea, vomiting, diarrhea or abdominal pain.  GENITOURINARY: No dysuria, hematuria.  ENDOCRINE: No polyuria, nocturia,  HEMATOLOGY: No anemia, easy bruising or bleeding SKIN: No rash or lesion. MUSCULOSKELETAL: No joint pain or arthritis.   NEUROLOGIC: No tingling, numbness, weakness.  PSYCHIATRY: No anxiety or depression.   MEDICATIONS AT HOME:   Prior to Admission medications   Medication Sig Start Date End Date Taking? Authorizing Provider  diphenhydrAMINE (BENADRYL) 25 mg capsule Take 25 mg by mouth every 6 (six) hours as needed for itching or allergies.   Yes Historical Provider, MD  medroxyPROGESTERone (DEPO-PROVERA) 150 MG/ML injection Inject 150 mg into the muscle every 3 (three) months.   Yes Historical Provider, MD  pramipexole (MIRAPEX) 0.25 MG tablet Take 0.25 mg by mouth 3 (three) times daily.   Yes Historical Provider, MD      VITAL SIGNS:  Blood pressure 106/74, pulse 107, temperature 99.5 F (37.5 C), temperature source Oral, resp. rate 17, height 5\' 6"  (1.676 m), weight 95.255 kg (210 lb), last menstrual period 08/12/2015, SpO2 97 %.  PHYSICAL EXAMINATION:  GENERAL:  25 y.o.-year-old patient lying in the bed with no acute distress.  EYES: Pupils equal, round, reactive to light and accommodation. No scleral icterus. Extraocular muscles intact.  HEENT: Head atraumatic, normocephalic. Oropharynx and nasopharynx clear.  NECK:  Supple, no jugular venous distention. No thyroid enlargement, no tenderness.  LUNGS: Normal breath sounds bilaterally, no wheezing, rales,rhonchi or crepitation. No use of accessory muscles of respiration.  CARDIOVASCULAR: S1, S2 normal, sinus tachycardia,  No murmurs, rubs, or gallops.  ABDOMEN: Soft, nontender, nondistended. Bowel sounds present. No organomegaly or mass.  EXTREMITIES: No pedal edema, cyanosis, or clubbing.   NEUROLOGIC: Cranial nerves II through XII are intact. Muscle strength 5/5 in all extremities. Sensation intact. Gait not checked.  PSYCHIATRIC: The patient is alert and oriented x 3.  SKIN: No obvious rash, lesion, or ulcer.   LABORATORY PANEL:   CBC  Recent Labs Lab 09/12/15 1349  WBC 9.4  HGB 12.2  HCT 38.2  PLT 292   ------------------------------------------------------------------------------------------------------------------  Chemistries   Recent Labs Lab 09/12/15 1349  NA 137  K 4.0  CL 104  CO2 25  GLUCOSE 89  BUN 9  CREATININE 0.89  CALCIUM 9.1  AST 17  ALT 12*  ALKPHOS 59  BILITOT 0.7   ------------------------------------------------------------------------------------------------------------------  Cardiac Enzymes No results for input(s): TROPONINI in the last 168 hours. ------------------------------------------------------------------------------------------------------------------  RADIOLOGY:  Dg Neck Soft Tissue  09/12/2015  CLINICAL DATA:  Chest pain for 1 day. Itching sensation in the throat and hoarseness for 1 day. Initial encounter. EXAM: NECK SOFT TISSUES - 1+ VIEW COMPARISON:  Plain film cervical spine 06/24/2014. FINDINGS: There is no evidence of retropharyngeal soft tissue swelling or epiglottic enlargement. The cervical airway is unremarkable and no radio-opaque foreign body identified. IMPRESSION: Negative exam. Electronically Signed   By: Inge Rise M.D.   On: 09/12/2015 14:40   Dg Chest 2 View  09/12/2015  CLINICAL DATA:  Chest pain throat itching for 1 day EXAM: CHEST  2 VIEW COMPARISON:  08/31/2010 FINDINGS: The heart size and vascular pattern are normal. No consolidation or effusion. Mild perihilar bronchial wall thickening increased from prior study. No significant change in airway caliber when compared to prior study. IMPRESSION: No active cardiopulmonary disease. Electronically Signed   By: Skipper Cliche M.D.   On:  09/12/2015 14:42   Ct Angio Chest Pe W/cm &/or Wo Cm  09/12/2015  CLINICAL DATA:  States she believes she is having a allergic reaction, pt is not sure what the reaction is too, states she drank some strawberry lemonade yesterday and that she believes that might be it, pt speaking in full complete sentances,. Left chest pain. EXAM: CT ANGIOGRAPHY CHEST WITH CONTRAST TECHNIQUE: Multidetector CT imaging of the chest was performed using the standard protocol during bolus administration of intravenous contrast. Multiplanar CT image reconstructions and MIPs were obtained to evaluate the vascular anatomy. CONTRAST:  51mL OMNIPAQUE IOHEXOL 350 MG/ML SOLN COMPARISON:  None. FINDINGS: Vascular: Right arm IV contrast injection. The SVC is patent. Right atrium is nondilated. RV/LV ratio is normal. Satisfactory opacification of pulmonary arteries noted, and there is no evidence of pulmonary emboli. Patent bilateral pulmonary veins. Adequate contrast opacification of the thoracic aorta with no evidence of dissection, aneurysm, or stenosis. There is bovine variant brachiocephalic arch anatomy without proximal stenosis. Mediastinum/Lymph Nodes: No masses or pathologically enlarged lymph nodes identified. No pericardial effusion. Lungs/Pleura: No pulmonary mass, infiltrate, or effusion. No pneumothorax. Upper abdomen: No acute findings. Musculoskeletal: No chest wall mass or suspicious bone lesions identified. Review of the MIP images confirms the above findings. IMPRESSION: 1. Negative. No evidence of pulmonary embolus or thoracic dissection. Electronically Signed   By: Lucrezia Europe M.D.   On: 09/12/2015 15:48    EKG:   Orders placed or performed during the hospital encounter of 09/12/15  . EKG 12-Lead  . EKG 12-Lead    IMPRESSION AND PLAN:   Emily Levy is a 25 year old Caucasian female with chronic  history of tachycardia is presenting to the ED with a chief complaint of scratchiness in her throat and rash after  drinking lemonade. CT angiogram of the chest is negative. Hospitalist team is called to admit the patient for persistent tachycardia, low-grade fever 99.5. Blood cultures were ordered  1. Allergic reaction with lemonade Rash is resolved. Patient denies any shortness of breath or throat closing sensation Continue Benadryl 25 MG by mouth every 6 hours as needed for itching or rash Provide Zyrtec and Pepcid Monitor patient closely. Not considering steroids at this time as her rash is completely resolved  2. Acute  sinus tachycardia with low-grade fever and scratchy throat -probably URI We will get throat culture and sensitivity. Blood cultures 2 were ordered in the ED by the ED physician. Provide hydration with IV fluids Tylenol as needed Will consider antibiotics if throat culture is positive Symptomatically management TSH in a.m. Flu test is negative   3. Acute on chronic sinus tachycardia Patient had cardiac stress test done a few years ago for her sinus tachycardia which was normal.. We will get an echocardiogram and rounding physician can put a cardiology consult at his discretion in a.m. TSH in a.m.  4. Chest pain-reproducible, probably musculoskeletal Cycle cardiac biomarkers Monitor patient on telemetry Get echocardiogram Pain management as needed Pepcid for GI prophylaxis If needed cardiology consult at rounding physician's discretion One dose of Xanax for anxiety          All the records are reviewed and case discussed with ED provider. Management plans discussed with the patient and her sister at bedside, they are in agreement.  CODE STATUS: FC, MOM is the HCPOA  TOTAL TIME TAKING CARE OF THIS PATIENT: 45 minutes.    Nicholes Mango M.D on 09/12/2015 at 7:24 PM  Between 7am to 6pm - Pager - [phone]  After 6pm go to [email or web address] - password [email or web address] [facility]  [facility]  Office  [phone]  CC: Primary care physician; [place]

## 2015-09-13 ENCOUNTER — Observation Stay (HOSPITAL_BASED_OUTPATIENT_CLINIC_OR_DEPARTMENT_OTHER)
Admit: 2015-09-13 | Discharge: 2015-09-13 | Disposition: A | Discharge status: Home / Self Care | Payer: MEDICAID | Attending: Internal Medicine | Admitting: Internal Medicine

## 2015-09-13 DIAGNOSIS — I509 Heart failure, unspecified: Secondary | ICD-10-CM

## 2015-09-13 LAB — COMPREHENSIVE METABOLIC PANEL
ALT: 10 U/L — AB (ref 14–54)
AST: 15 U/L (ref 15–41)
Albumin: 3.1 g/dL — ABNORMAL LOW (ref 3.5–5.0)
Alkaline Phosphatase: 49 U/L (ref 38–126)
Anion gap: 4 — ABNORMAL LOW (ref 5–15)
BILIRUBIN TOTAL: 0.4 mg/dL (ref 0.3–1.2)
BUN: 8 mg/dL (ref 6–20)
CO2: 24 mmol/L (ref 22–32)
CREATININE: 0.88 mg/dL (ref 0.44–1.00)
Calcium: 7.6 mg/dL — ABNORMAL LOW (ref 8.9–10.3)
Chloride: 104 mmol/L (ref 101–111)
Glucose, Bld: 98 mg/dL (ref 65–99)
POTASSIUM: 3.6 mmol/L (ref 3.5–5.1)
Sodium: 132 mmol/L — ABNORMAL LOW (ref 135–145)
TOTAL PROTEIN: 6.4 g/dL — AB (ref 6.5–8.1)

## 2015-09-13 LAB — TROPONIN I: Troponin I: <0.03 ng/mL (ref <0.031)

## 2015-09-13 LAB — CBC
HCT: 33.8 % — ABNORMAL LOW (ref 35.0–47.0)
Hemoglobin: 10.9 g/dL — ABNORMAL LOW (ref 12.0–16.0)
MCH: 25.3 pg — ABNORMAL LOW (ref 26.0–34.0)
MCHC: 32.2 g/dL (ref 32.0–36.0)
MCV: 78.6 fL — ABNORMAL LOW (ref 80.0–100.0)
PLATELETS: 252 10*3/uL (ref 150–440)
RBC: 4.3 MIL/uL (ref 3.80–5.20)
RDW: 15.1 % — AB (ref 11.5–14.5)
WBC: 4.4 10*3/uL (ref 3.6–11.0)

## 2015-09-13 LAB — TSH: TSH: 0.375 u[IU]/mL (ref 0.350–4.500)

## 2015-09-13 MED ORDER — GUAIFENESIN-CODEINE 100-10 MG/5ML PO SOLN
10.0000 mL | ORAL | Status: DC | PRN
  Administered 2015-09-13: 10 mL via ORAL
  Filled 2015-09-13: qty 10

## 2015-09-13 MED ORDER — TRAMADOL HCL 50 MG PO TABS: 50.0000 mg | ORAL_TABLET | Freq: Four times a day (QID) | ORAL | Status: DC | PRN

## 2015-09-13 MED ORDER — GUAIFENESIN-CODEINE 100-10 MG/5ML PO SOLN: 10.0000 mL | ORAL | Status: DC | PRN

## 2015-09-13 NOTE — Discharge Summary (Signed)
[place] at [place] NAME: Emily Levy    MR#:  [id number]  DATE OF BIRTH:  1991/06/18  DATE OF ADMISSION:  09/12/2015 ADMITTING PHYSICIAN: Nicholes Mango, MD  DATE OF DISCHARGE: No discharge date for patient encounter.  PRIMARY CARE PHYSICIAN: [facility]    ADMISSION DIAGNOSIS:  Chest pain [R07.9] Tachycardia with 121 - 140 beats per minute [R00.0]  DISCHARGE DIAGNOSIS:  Active Problems:   Sinus tachycardia (HCC)  noncardiac chest pain-most skeletal  SECONDARY DIAGNOSIS:  History reviewed. No pertinent past medical history.  HOSPITAL COURSE:  Emily Levy  is a 25 y.o. female admitted 09/12/2015 with chief complaint allergic reaction after drinking lemonade. She states she felt short of breath rash, rash resolved in the emergency department after administration of Benadryl she is noted to have tachycardia with some associated chest pain retrosternal in location. Her heart rate improved with IV fluid hydration, her chest pain also improved now only present with a nonproductive cough. Imaging test negative for pulmonary embolism her pain is likely musculoskeletal in etiology as it is reproducible on palpitation  DISCHARGE CONDITIONS:   Stable/improved  CONSULTS OBTAINED:     DRUG ALLERGIES:   Allergies  Allergen Reactions  . Sulfa Antibiotics Hives    DISCHARGE MEDICATIONS:   Current Discharge Medication List    START taking these medications   Details  guaiFENesin-codeine 100-10 MG/5ML syrup Take 10 mLs by mouth every 4 (four) hours as needed for cough. Qty: 120 mL, Refills: 0    traMADol (ULTRAM) 50 MG tablet Take 1 tablet (50 mg total) by mouth every 6 (six) hours as needed for moderate pain. Qty: 30 tablet, Refills: 0      CONTINUE these medications which have NOT CHANGED   Details  diphenhydrAMINE (BENADRYL) 25 mg capsule Take 25 mg by mouth every 6 (six) hours as needed for itching or  allergies.    medroxyPROGESTERone (DEPO-PROVERA) 150 MG/ML injection Inject 150 mg into the muscle every 3 (three) months.    pramipexole (MIRAPEX) 0.25 MG tablet Take 0.25 mg by mouth 3 (three) times daily.         DISCHARGE INSTRUCTIONS:    DIET:  Regular diet  DISCHARGE CONDITION:  Good  ACTIVITY:  Activity as tolerated  OXYGEN:  Home Oxygen: No.   Oxygen Delivery: room air  DISCHARGE LOCATION:  home   If you experience worsening of your admission symptoms, develop shortness of breath, life threatening emergency, suicidal or homicidal thoughts you must seek medical attention immediately by calling 911 or calling your MD immediately  if symptoms less severe.  You Must read complete instructions/literature along with all the possible adverse reactions/side effects for all the Medicines you take and that have been prescribed to you. Take any new Medicines after you have completely understood and accpet all the possible adverse reactions/side effects.   Please note  You were cared for by a hospitalist during your hospital stay. If you have any questions about your discharge medications or the care you received while you were in the hospital after you are discharged, you can call the unit and asked to speak with the hospitalist on call if the hospitalist that took care of you is not available. Once you are discharged, your primary care physician will handle any further medical issues. Please note that NO REFILLS for any discharge medications will be authorized once you are discharged, as it is imperative that you return to your primary care  physician (or establish a relationship with a primary care physician if you do not have one) for your aftercare needs so that they can reassess your need for medications and monitor your lab values.    On the day of Discharge:   VITAL SIGNS:  Blood pressure 112/79, pulse 98, temperature 98.3 F (36.8 C), temperature source Oral, resp.  rate 16, height 5\' 6"  (1.676 m), weight 102.286 kg (225 lb 8 oz), last menstrual period 08/12/2015, SpO2 98 %.  I/O:   Intake/Output Summary (Last 24 hours) at 09/13/15 1024 Last data filed at 09/13/15 0658  Gross per 24 hour  Intake 886.67 ml  Output    350 ml  Net 536.67 ml    PHYSICAL EXAMINATION:  GENERAL:  25 y.o.-year-old patient lying in the bed with no acute distress.  EYES: Pupils equal, round, reactive to light and accommodation. No scleral icterus. Extraocular muscles intact.  HEENT: Head atraumatic, normocephalic. Oropharynx and nasopharynx clear.  NECK:  Supple, no jugular venous distention. No thyroid enlargement, no tenderness.  LUNGS: Normal breath sounds bilaterally, no wheezing, rales,rhonchi or crepitation. No use of accessory muscles of respiration.  CARDIOVASCULAR: S1, S2 normal. No murmurs, rubs, or gallops.  ABDOMEN: Soft, non-tender, non-distended. Bowel sounds present. No organomegaly or mass.  EXTREMITIES: No pedal edema, cyanosis, or clubbing.  NEUROLOGIC: Cranial nerves II through XII are intact. Muscle strength 5/5 in all extremities. Sensation intact. Gait not checked.  PSYCHIATRIC: The patient is alert and oriented x 3.  SKIN: No obvious rash, lesion, or ulcer.   DATA REVIEW:   CBC  Recent Labs Lab 09/13/15 0732  WBC 4.4  HGB 10.9*  HCT 33.8*  PLT 252    Chemistries   Recent Labs Lab 09/13/15 0732  NA 132*  K 3.6  CL 104  CO2 24  GLUCOSE 98  BUN 8  CREATININE 0.88  CALCIUM 7.6*  AST 15  ALT 10*  ALKPHOS 49  BILITOT 0.4    Cardiac Enzymes  Recent Labs Lab 09/13/15 0732  TROPONINI <0.03    Microbiology Results  Results for orders placed or performed during the hospital encounter of 09/12/15  Culture, group A strep     Status: None (Preliminary result)   Collection Time: 09/12/15  1:49 PM  Result Value Ref Range Status   Specimen Description THROAT  Final   Special Requests NONE  Final   Culture TOO YOUNG TO READ   Final   Report Status PENDING  Incomplete  Rapid Influenza A&B Antigens ([place] only)     Status: None   Collection Time: 09/12/15  2:55 PM  Result Value Ref Range Status   Influenza A ([facility]) NEGATIVE  Final   Influenza B ([facility]) NEGATIVE  Final  Culture, blood (Routine X 2) w Reflex to ID Panel     Status: None (Preliminary result)   Collection Time: 09/12/15  6:16 PM  Result Value Ref Range Status   Specimen Description BLOOD RIGHT ASSIST CONTROL  Final   Special Requests BOTTLES DRAWN AEROBIC AND ANAEROBIC  2CC  Final   Culture NO GROWTH < 12 HOURS  Final   Report Status PENDING  Incomplete  Culture, blood (Routine X 2) w Reflex to ID Panel     Status: None (Preliminary result)   Collection Time: 09/12/15  6:16 PM  Result Value Ref Range Status   Specimen Description BLOOD LEFT ASSIST CONTROL  Final   Special Requests BOTTLES DRAWN AEROBIC AND ANAEROBIC 3CC  Final   Culture  NO GROWTH < 12 HOURS  Final   Report Status PENDING  Incomplete    RADIOLOGY:  Dg Neck Soft Tissue  09/12/2015  CLINICAL DATA:  Chest pain for 1 day. Itching sensation in the throat and hoarseness for 1 day. Initial encounter. EXAM: NECK SOFT TISSUES - 1+ VIEW COMPARISON:  Plain film cervical spine 06/24/2014. FINDINGS: There is no evidence of retropharyngeal soft tissue swelling or epiglottic enlargement. The cervical airway is unremarkable and no radio-opaque foreign body identified. IMPRESSION: Negative exam. Electronically Signed   By: Inge Rise M.D.   On: 09/12/2015 14:40   Dg Chest 2 View  09/12/2015  CLINICAL DATA:  Chest pain throat itching for 1 day EXAM: CHEST  2 VIEW COMPARISON:  08/31/2010 FINDINGS: The heart size and vascular pattern are normal. No consolidation or effusion. Mild perihilar bronchial wall thickening increased from prior study. No significant change in airway caliber when compared to prior study. IMPRESSION: No active cardiopulmonary disease. Electronically Signed   By: Skipper Cliche M.D.   On: 09/12/2015 14:42   Ct Angio Chest Pe W/cm &/or Wo Cm  09/12/2015  CLINICAL DATA:  States she believes she is having a allergic reaction, pt is not sure what the reaction is too, states she drank some strawberry lemonade yesterday and that she believes that might be it, pt speaking in full complete sentances,. Left chest pain. EXAM: CT ANGIOGRAPHY CHEST WITH CONTRAST TECHNIQUE: Multidetector CT imaging of the chest was performed using the standard protocol during bolus administration of intravenous contrast. Multiplanar CT image reconstructions and MIPs were obtained to evaluate the vascular anatomy. CONTRAST:  62mL OMNIPAQUE IOHEXOL 350 MG/ML SOLN COMPARISON:  None. FINDINGS: Vascular: Right arm IV contrast injection. The SVC is patent. Right atrium is nondilated. RV/LV ratio is normal. Satisfactory opacification of pulmonary arteries noted, and there is no evidence of pulmonary emboli. Patent bilateral pulmonary veins. Adequate contrast opacification of the thoracic aorta with no evidence of dissection, aneurysm, or stenosis. There is bovine variant brachiocephalic arch anatomy without proximal stenosis. Mediastinum/Lymph Nodes: No masses or pathologically enlarged lymph nodes identified. No pericardial effusion. Lungs/Pleura: No pulmonary mass, infiltrate, or effusion. No pneumothorax. Upper abdomen: No acute findings. Musculoskeletal: No chest wall mass or suspicious bone lesions identified. Review of the MIP images confirms the above findings. IMPRESSION: 1. Negative. No evidence of pulmonary embolus or thoracic dissection. Electronically Signed   By: Lucrezia Europe M.D.   On: 09/12/2015 15:48     Management plans discussed with the patient, family and they are in agreement.  CODE STATUS:     Code Status Orders        Start     Ordered   09/12/15 2143  Full code   Continuous     09/12/15 2142    Code Status History    Date Active Date Inactive Code Status Order ID Comments User  Context   This patient has a current code status but no historical code status.      TOTAL TIME TAKING CARE OF THIS PATIENT: 28 minutes.    Emily Levy,  Karenann Cai.D on 09/13/2015 at 10:24 AM  Between 7am to 6pm - Pager - [phone]  After 6pm go to [email or web address] - password [email or web address] [facility]  [facility]  Office  [phone]  CC: Primary care physician; [place]

## 2015-09-13 NOTE — Progress Notes (Signed)
Pt to be discharged today. Iv and tele removed. disch instructions, prescrips and work excuse given to pt. disch via w.c. Accompanied by mother

## 2015-09-13 NOTE — Progress Notes (Signed)
Ms Emily Levy was admitted to the hospital - [facility] 2/13--2/14. She should be considered excused from work for these days and would be able to return 09/15/15 unless she feels she can safely return sooner. For any questions/concerns feel free to call [phone] Regino Schultze MD

## 2015-09-13 NOTE — Progress Notes (Signed)
Pt admitted to room 257. VSS, A&Ox4, pt c/o pain in chest 7/10. No other concerns at this time. Pt oriented to floor and room, educated on telephone, call bell, and need to call nursing before up out of bed if feeling weak/dizzy. Skin assessed with Westley Gambles., RN. Telemetry verified with Janett Billow, [place]. RN will continue to monitor and treat per MD orders. Rachael Fee, RN

## 2015-09-13 NOTE — Progress Notes (Signed)
Pt c/o pain 7/10 unrelieved by tylenol. MD Dr. Jannifer Franklin notified. Orders given for one time dose of IV morphine 4mg . RN will administer and continue to monitor. Rachael Fee, RN

## 2015-09-13 NOTE — Progress Notes (Signed)
*  PRELIMINARY RESULTS* Echocardiogram 2D Echocardiogram has been performed.  Emily Levy 09/13/2015, 11:36 AM

## 2015-09-14 LAB — CULTURE, GROUP A STREP (THRC)

## 2015-09-17 LAB — CULTURE, BLOOD (ROUTINE X 2)
Culture: NO GROWTH
Culture: NO GROWTH

## 2015-12-16 ENCOUNTER — Encounter: Payer: Self-pay | Admitting: Emergency Medicine

## 2015-12-16 ENCOUNTER — Emergency Department
Admission: EM | Admit: 2015-12-16 | Discharge: 2015-12-16 | Disposition: A | Discharge status: Home / Self Care | Payer: Self-pay | Attending: Emergency Medicine | Admitting: Emergency Medicine

## 2015-12-16 DIAGNOSIS — Y999 Unspecified external cause status: Secondary | ICD-10-CM | POA: Insufficient documentation

## 2015-12-16 DIAGNOSIS — Y939 Activity, unspecified: Secondary | ICD-10-CM | POA: Insufficient documentation

## 2015-12-16 DIAGNOSIS — W57XXXA Bitten or stung by nonvenomous insect and other nonvenomous arthropods, initial encounter: Secondary | ICD-10-CM | POA: Insufficient documentation

## 2015-12-16 DIAGNOSIS — S70362A Insect bite (nonvenomous), left thigh, initial encounter: Secondary | ICD-10-CM | POA: Insufficient documentation

## 2015-12-16 DIAGNOSIS — Y929 Unspecified place or not applicable: Secondary | ICD-10-CM | POA: Insufficient documentation

## 2015-12-16 MED ORDER — TRIAMCINOLONE ACETONIDE 0.5 % EX OINT: 1.0000 "application " | TOPICAL_OINTMENT | Freq: Two times a day (BID) | CUTANEOUS | Status: DC

## 2015-12-16 NOTE — ED Provider Notes (Signed)
[facility] Emergency Department Provider Note   ____________________________________________  Time seen: Approximately 9:32 AM  I have reviewed the triage vital signs and the nursing notes.   HISTORY  Chief Complaint Insect Bite   HPI Emily Levy is a 25 y.o. female is here with complaint of a raised area on the back of her left thigh after being bit by an insect on Tuesday. Patient states that it has been itching but there is been no fever or chills. Patient states that she has had a mild headache and some mild nausea since the bite but no other symptoms. She has not used any over-the-counter medication for itching or any topical creams.Currently she rates her discomfort as a 10 over 10.   History reviewed. No pertinent past medical history.  Patient Active Problem List   Diagnosis Date Noted  . Sinus tachycardia ([place]) 09/12/2015    Past Surgical History  Procedure Laterality Date  . Hernia repair      Current Outpatient Rx  Name  Route  Sig  Dispense  Refill  . diphenhydrAMINE (BENADRYL) 25 mg capsule   Oral   Take 25 mg by mouth every 6 (six) hours as needed for itching or allergies.         . medroxyPROGESTERone (DEPO-PROVERA) 150 MG/ML injection   Intramuscular   Inject 150 mg into the muscle every 3 (three) months.         . pramipexole (MIRAPEX) 0.25 MG tablet   Oral   Take 0.25 mg by mouth 3 (three) times daily.         Marland Kitchen triamcinolone ointment (KENALOG) 0.5 %   Topical   Apply 1 application topically 2 (two) times daily.   30 g   0     Allergies Sulfa antibiotics  No family history on file.  Social History Social History  Substance Use Topics  . Smoking status: Never Smoker   . Smokeless tobacco: Never Used  . Alcohol Use: No    Review of Systems Constitutional: No fever/chills Eyes: No visual changes. ENT: No sore throat. Cardiovascular: Denies chest pain. Respiratory: Denies shortness of  breath. Gastrointestinal: No abdominal pain.  No nausea, no vomiting.   Musculoskeletal: Negative for back pain and extremity pain. Skin: Positive insect bite Neurological: Positive for headaches, no focal weakness or numbness.  10-point ROS otherwise negative.  ____________________________________________   PHYSICAL EXAM:  VITAL SIGNS: ED Triage Vitals  Enc Vitals Group     BP 12/16/15 0908 108/71 mmHg     Pulse Rate 12/16/15 0908 80     Resp 12/16/15 0908 16     Temp 12/16/15 0908 98.4 F (36.9 C)     Temp Source 12/16/15 0908 Oral     SpO2 12/16/15 0908 100 %     Weight 12/16/15 0908 211 lb (95.709 kg)     Height 12/16/15 0908 5\' 6"  (1.676 m)     Head Cir --      Peak Flow --      Pain Score 12/16/15 0919 10     Pain Loc --      Pain Edu? --      Excl. in [place]? --     Constitutional: Alert and oriented. Well appearing and in no acute distress. Eyes: Conjunctivae are normal. PERRL. EOMI. Head: Atraumatic. Nose: No congestion/rhinnorhea. Neck: No stridor.   Hematological/Lymphatic/Immunilogical: No cervical lymphadenopathy. Cardiovascular: Normal rate, regular rhythm. Grossly normal heart sounds.  Good peripheral circulation. Respiratory: Normal  respiratory effort.  No retractions. Lungs CTAB. Musculoskeletal: No lower extremity tenderness nor edema.  No joint effusions. Neurologic:  Normal speech and language. No gross focal neurologic deficits are appreciated. No gait instability. Skin:  Skin is warm, dry area and there is a single erythematous papule posterior left thigh without surrounding cellulitis. The area is not draining. Psychiatric: Mood and affect are normal. Speech and behavior are normal.  ____________________________________________   LABS (all labs ordered are listed, but only abnormal results are displayed)  Labs Reviewed - No data to display  PROCEDURES  Procedure(s) performed: None  Critical Care performed:  No  ____________________________________________   INITIAL IMPRESSION / ASSESSMENT AND PLAN / ED COURSE  Pertinent labs & imaging results that were available during my care of the patient were reviewed by me and considered in my medical decision making (see chart for details).  She is given prescription for Kenalog to apply to the area as needed for itching or irritation. She is to follow-up with her primary care doctor [facility] clinic if any continued problems. ____________________________________________   FINAL CLINICAL IMPRESSION(S) / ED DIAGNOSES  Final diagnoses:  Insect bite (nonvenomous), left thigh, initial encounter (CODE)      NEW MEDICATIONS STARTED DURING THIS VISIT:  Discharge Medication List as of 12/16/2015  9:42 AM    START taking these medications   Details  triamcinolone ointment (KENALOG) 0.5 % Apply 1 application topically 2 (two) times daily., Starting 12/16/2015, Until Discontinued, Print         Note:  This document was prepared using Dragon voice recognition software and may include unintentional dictation errors.    Johnn Hai, PA-C 12/16/15 [place], MD 12/16/15 [phone]

## 2015-12-16 NOTE — ED Notes (Signed)
Pt presents with raised area to back of left thigh. States she felt a bug on her and brushed it off on Tuesday. Pt states it began itching right away and has been very intense (11/10). No redness or swelling noted to area around the small area of presumed bite. Pt also states she has had a headache and nausea since the bite. NAD noted.

## 2015-12-16 NOTE — ED Notes (Signed)
Reports swiped a bug off her left thigh on Tuesday, states since then itching at the site and nausea.

## 2015-12-16 NOTE — Discharge Instructions (Signed)
Insect Bite Mosquitoes, flies, fleas, bedbugs, and other insects can bite. Insect bites are different from insect stings. The bite may be red, puffy (swollen), and itchy for 2 to 4 days. Most bites get better on their own. HOME CARE   Do not scratch the bite.  Keep the bite clean and dry. Wash the bite with soap and water every day, as told by your doctor.  If directed, apply ice to the bite area.  Put ice in a plastic bag.  Place a towel between your skin and the bag.  Leave the ice on for 20 minutes, 2-3 times per day.  Follow instructions from your doctor about using medicated lotions or creams. These can help with itching.  Apply or take over-the-counter and prescription medicines only as told by your doctor.  If you were given an antibiotic medicine, use it as told by your doctor. Do not stop using the medicine even if your condition improves.  Keep all follow-up visits as told by your doctor. This is important. GET HELP IF:  You have redness, swelling (inflammation), or pain near your bite that is getting worse.  You have a fever. GET HELP RIGHT AWAY IF:   You have joint pain.   You have fluid, blood, or pus coming from the bite area.   You have a headache.  You have neck pain.  You feel weaker than you normally do.   You have a rash.   You have chest pain.  You have shortness of breath.  You have stomach pain, feel sick to your stomach (nauseous), or throw up (vomit).  You feel more tired or sleepy than you normally do.   This information is not intended to replace advice given to you by your health care provider. Make sure you discuss any questions you have with your health care provider.   Document Released: 07/13/2000 Document Revised: 04/06/2015 Document Reviewed: 12/01/2014 Elsevier Interactive Patient Education 2016 [place] with your primary care doctor at [facility] if any continued problems. Begin taking Benadryl as  needed for itching. There is a prescription for Kenalog cream/ointment to be applied to the area twice a day.

## 2015-12-16 NOTE — ED Notes (Signed)
Pt discharged home after verbalizing understanding of discharge instructions; nad noted. 

## 2015-12-29 ENCOUNTER — Emergency Department: Payer: Self-pay

## 2015-12-29 ENCOUNTER — Encounter: Payer: Self-pay | Admitting: Emergency Medicine

## 2015-12-29 ENCOUNTER — Emergency Department
Admission: EM | Admit: 2015-12-29 | Discharge: 2015-12-29 | Disposition: A | Discharge status: Home / Self Care | Payer: Self-pay | Attending: Emergency Medicine | Admitting: Emergency Medicine

## 2015-12-29 DIAGNOSIS — R1030 Lower abdominal pain, unspecified: Secondary | ICD-10-CM | POA: Insufficient documentation

## 2015-12-29 DIAGNOSIS — N898 Other specified noninflammatory disorders of vagina: Secondary | ICD-10-CM | POA: Insufficient documentation

## 2015-12-29 DIAGNOSIS — Z79899 Other long term (current) drug therapy: Secondary | ICD-10-CM | POA: Insufficient documentation

## 2015-12-29 DIAGNOSIS — R109 Unspecified abdominal pain: Secondary | ICD-10-CM

## 2015-12-29 DIAGNOSIS — R102 Pelvic and perineal pain: Secondary | ICD-10-CM

## 2015-12-29 LAB — COMPREHENSIVE METABOLIC PANEL
ALT: 10 U/L — ABNORMAL LOW (ref 14–54)
ANION GAP: 5 (ref 5–15)
AST: 15 U/L (ref 15–41)
Albumin: 3.9 g/dL (ref 3.5–5.0)
Alkaline Phosphatase: 53 U/L (ref 38–126)
BUN: 10 mg/dL (ref 6–20)
CHLORIDE: 109 mmol/L (ref 101–111)
CO2: 25 mmol/L (ref 22–32)
Calcium: 9 mg/dL (ref 8.9–10.3)
Creatinine, Ser: 1.02 mg/dL — ABNORMAL HIGH (ref 0.44–1.00)
GFR calc Af Amer: >60 mL/min (ref >60)
Glucose, Bld: 81 mg/dL (ref 65–99)
POTASSIUM: 3.7 mmol/L (ref 3.5–5.1)
Sodium: 139 mmol/L (ref 135–145)
TOTAL PROTEIN: 7.4 g/dL (ref 6.5–8.1)
Total Bilirubin: 0.6 mg/dL (ref 0.3–1.2)

## 2015-12-29 LAB — URINE DRUG SCREEN, QUALITATIVE (ARMC ONLY)
Amphetamines, Ur Screen: NOT DETECTED
BARBITURATES, UR SCREEN: NOT DETECTED
BENZODIAZEPINE, UR SCRN: NOT DETECTED
Cannabinoid 50 Ng, Ur ~~LOC~~: NOT DETECTED
Cocaine Metabolite,Ur ~~LOC~~: NOT DETECTED
MDMA (Ecstasy)Ur Screen: NOT DETECTED
METHADONE SCREEN, URINE: NOT DETECTED
OPIATE, UR SCREEN: NOT DETECTED
Phencyclidine (PCP) Ur S: NOT DETECTED
TRICYCLIC, UR SCREEN: NOT DETECTED

## 2015-12-29 LAB — CBC WITH DIFFERENTIAL/PLATELET
BASOS ABS: 0 10*3/uL (ref 0–0.1)
BASOS PCT: 1 %
EOS ABS: 0.1 10*3/uL (ref 0–0.7)
EOS PCT: 1 %
HCT: 34.4 % — ABNORMAL LOW (ref 35.0–47.0)
Hemoglobin: 11.5 g/dL — ABNORMAL LOW (ref 12.0–16.0)
LYMPHS ABS: 2.9 10*3/uL (ref 1.0–3.6)
LYMPHS PCT: 33 %
MCH: 25.8 pg — ABNORMAL LOW (ref 26.0–34.0)
MCHC: 33.3 g/dL (ref 32.0–36.0)
MCV: 77.4 fL — AB (ref 80.0–100.0)
Monocytes Absolute: 0.8 10*3/uL (ref 0.2–0.9)
Monocytes Relative: 9 %
NEUTROS ABS: 5 10*3/uL (ref 1.4–6.5)
NEUTROS PCT: 56 %
PLATELETS: 254 10*3/uL (ref 150–440)
RBC: 4.45 MIL/uL (ref 3.80–5.20)
RDW: 15 % — AB (ref 11.5–14.5)
WBC: 8.8 10*3/uL (ref 3.6–11.0)

## 2015-12-29 LAB — URINALYSIS COMPLETE WITH MICROSCOPIC (ARMC ONLY)
Bilirubin Urine: NEGATIVE
Glucose, UA: NEGATIVE mg/dL
KETONES UR: NEGATIVE mg/dL
LEUKOCYTES UA: NEGATIVE
Nitrite: NEGATIVE
PH: 5 (ref 5.0–8.0)
PROTEIN: NEGATIVE mg/dL
Specific Gravity, Urine: 1.02 (ref 1.005–1.030)

## 2015-12-29 LAB — WET PREP, GENITAL
Clue Cells Wet Prep HPF POC: NONE SEEN
SPERM: NONE SEEN
Trich, Wet Prep: NONE SEEN
Yeast Wet Prep HPF POC: NONE SEEN

## 2015-12-29 LAB — LIPASE, BLOOD: Lipase: 28 U/L (ref 11–51)

## 2015-12-29 LAB — CHLAMYDIA/NGC RT PCR (ARMC ONLY)
Chlamydia Tr: NOT DETECTED
N GONORRHOEAE: NOT DETECTED

## 2015-12-29 LAB — PREGNANCY, URINE: PREG TEST UR: NEGATIVE

## 2015-12-29 MED ORDER — CEFTRIAXONE SODIUM 250 MG IJ SOLR
250.0000 mg | Freq: Once | INTRAMUSCULAR | Status: AC
  Administered 2015-12-29: 250 mg via INTRAMUSCULAR
  Filled 2015-12-29: qty 250

## 2015-12-29 MED ORDER — KETOROLAC TROMETHAMINE 30 MG/ML IJ SOLN
30.0000 mg | Freq: Once | INTRAMUSCULAR | Status: DC
  Filled 2015-12-29: qty 1

## 2015-12-29 MED ORDER — KETOROLAC TROMETHAMINE 60 MG/2ML IM SOLN
30.0000 mg | Freq: Once | INTRAMUSCULAR | Status: AC
  Administered 2015-12-29: 30 mg via INTRAMUSCULAR

## 2015-12-29 MED ORDER — DOXYCYCLINE HYCLATE 100 MG PO TABS
100.0000 mg | ORAL_TABLET | Freq: Once | ORAL | Status: AC
  Administered 2015-12-29: 100 mg via ORAL
  Filled 2015-12-29: qty 1

## 2015-12-29 NOTE — ED Provider Notes (Signed)
[facility] Emergency Department Provider Note   ____________________________________________  Time seen: Approximately 1:18 PM  I have reviewed the triage vital signs and the nursing notes.   HISTORY  Chief Complaint Abdominal Cramping    HPI Emily Levy is a 25 y.o. female who reports she got the DIP O shot beginning of last month May. Last night she was in the shower passed a quarter size blood clot. This morning she's been having lower abdominal crampy pain. She reports is crampy pain as 10 times worse than normal menstrual cramps. Same quality but much more intense. She has not had this happen before. Nothing seems to make it better or worse. He denies any nausea vomiting fever chills or any other complaints.   History reviewed. No pertinent past medical history.  Patient Active Problem List   Diagnosis Date Noted  . Sinus tachycardia ([place]) 09/12/2015    Past Surgical History  Procedure Laterality Date  . Hernia repair      Current Outpatient Rx  Name  Route  Sig  Dispense  Refill  . diphenhydrAMINE (BENADRYL) 25 mg capsule   Oral   Take 25 mg by mouth every 6 (six) hours as needed for itching or allergies.         . medroxyPROGESTERone (DEPO-PROVERA) 150 MG/ML injection   Intramuscular   Inject 150 mg into the muscle every 3 (three) months.         . pramipexole (MIRAPEX) 0.25 MG tablet   Oral   Take 0.25 mg by mouth 3 (three) times daily.         Marland Kitchen triamcinolone ointment (KENALOG) 0.5 %   Topical   Apply 1 application topically 2 (two) times daily.   30 g   0     Allergies Sulfa antibiotics  History reviewed. No pertinent family history.  Social History Social History  Substance Use Topics  . Smoking status: Never Smoker   . Smokeless tobacco: Never Used  . Alcohol Use: No    Review of Systems Constitutional: No fever/chills Eyes: No visual changes. ENT: No sore throat. Cardiovascular: Denies chest  pain. Respiratory: Denies shortness of breath. Gastrointestinal: See history of present illness Genitourinary: Negative for dysuria. Musculoskeletal: Negative for back pain. Skin: Negative for rash. Neurological: Negative for headaches, focal weakness or numbness.  10-point ROS otherwise negative.  ____________________________________________   PHYSICAL EXAM:  VITAL SIGNS: ED Triage Vitals  Enc Vitals Group     BP 12/29/15 1246 104/71 mmHg     Pulse Rate 12/29/15 1246 85     Resp 12/29/15 1246 20     Temp 12/29/15 1246 98.5 F (36.9 C)     Temp Source 12/29/15 1246 Oral     SpO2 12/29/15 1246 100 %     Weight 12/29/15 1246 212 lb (96.163 kg)     Height 12/29/15 1246 5\' 5"  (1.651 m)     Head Cir --      Peak Flow --      Pain Score 12/29/15 1248 7     Pain Loc --      Pain Edu? --      Excl. in [place]? --     Constitutional: Alert and oriented. Well appearing and in no acute distress. Eyes: Conjunctivae are normal. PERRL. EOMI. Head: Atraumatic. Nose: No congestion/rhinnorhea. Mouth/Throat: Mucous membranes are moist.  Oropharynx non-erythematous. Neck: No stridor.  Cardiovascular: Normal rate, regular rhythm. Grossly normal heart sounds.  Good peripheral circulation. Respiratory: Normal  respiratory effort.  No retractions. Lungs CTAB. Gastrointestinal: Soft and nontender. No distention. No abdominal bruits. No CVA tenderness. Genitourinary: Some whitish discharge in the vagina. There is no adnexal tenderness or masses. There is small amount of cervical motion tenderness however. Musculoskeletal: No lower extremity tenderness nor edema.  No joint effusions. Neurologic:  Normal speech and language. No gross focal neurologic deficits are appreciated. No gait instability. Skin:  Skin is warm, dry and intact. No rash noted. Psychiatric: Mood and affect are normal. Speech and behavior are normal.  ____________________________________________   LABS (all labs ordered are  listed, but only abnormal results are displayed)  Labs Reviewed  WET PREP, GENITAL - Abnormal; Notable for the following:    WBC, Wet Prep HPF POC RARE (*)    All other components within normal limits  URINALYSIS COMPLETEWITH MICROSCOPIC ([facility] ONLY) - Abnormal; Notable for the following:    Color, Urine YELLOW (*)    APPearance CLEAR (*)    Hgb urine dipstick 1+ (*)    Bacteria, UA RARE (*)    Squamous Epithelial / LPF 0-5 (*)    All other components within normal limits  COMPREHENSIVE METABOLIC PANEL - Abnormal; Notable for the following:    Creatinine, Ser 1.02 (*)    ALT 10 (*)    All other components within normal limits  CBC WITH DIFFERENTIAL/PLATELET - Abnormal; Notable for the following:    Hemoglobin 11.5 (*)    HCT 34.4 (*)    MCV 77.4 (*)    MCH 25.8 (*)    RDW 15.0 (*)    All other components within normal limits  CHLAMYDIA/NGC RT PCR ([facility] ONLY)  PREGNANCY, URINE  URINE DRUG SCREEN, QUALITATIVE ([facility] ONLY)  LIPASE, BLOOD   ____________________________________________  EKG   ____________________________________________  RADIOLOGY  Ultrasound shows some free fluid with some debris. ____________________________________________   PROCEDURES  Discussed with patient and her mother. I do not think there is no infection since the wet prep shows WBCs however just to be safe patient and her mother request I treat her. I will do this.  ____________________________________________   INITIAL IMPRESSION / ASSESSMENT AND PLAN / ED COURSE  Pertinent labs & imaging results that were available during my care of the patient were reviewed by me and considered in my medical decision making (see chart for details).   ____________________________________________   FINAL CLINICAL IMPRESSION(S) / ED DIAGNOSES  Final diagnoses:  Abdominal pain, unspecified abdominal location      NEW MEDICATIONS STARTED DURING THIS VISIT:  Discharge Medication List as  of 12/29/2015  3:38 PM       Note:  This document was prepared using Dragon voice recognition software and may include unintentional dictation errors.    Nena Polio, MD 12/29/15 2045

## 2015-12-29 NOTE — Discharge Instructions (Signed)
Abdominal Pain, Adult Many things can cause abdominal pain. Usually, abdominal pain is not caused by a disease and will improve without treatment. It can often be observed and treated at home. Your health care provider will do a physical exam and possibly order blood tests and X-rays to help determine the seriousness of your pain. However, in many cases, more time must pass before a clear cause of the pain can be found. Before that point, your health care provider may not know if you need more testing or further treatment. HOME CARE INSTRUCTIONS Monitor your abdominal pain for any changes. The following actions may help to alleviate any discomfort you are experiencing:  Only take over-the-counter or prescription medicines as directed by your health care provider.  Do not take laxatives unless directed to do so by your health care provider.  Try a clear liquid diet (broth, tea, or water) as directed by your health care provider. Slowly move to a bland diet as tolerated. SEEK MEDICAL CARE IF:  You have unexplained abdominal pain.  You have abdominal pain associated with nausea or diarrhea.  You have pain when you urinate or have a bowel movement.  You experience abdominal pain that wakes you in the night.  You have abdominal pain that is worsened or improved by eating food.  You have abdominal pain that is worsened with eating fatty foods.  You have a fever. SEEK IMMEDIATE MEDICAL CARE IF:  Your pain does not go away within 2 hours.  You keep throwing up (vomiting).  Your pain is felt only in portions of the abdomen, such as the right side or the left lower portion of the abdomen.  You pass bloody or black tarry stools. MAKE SURE YOU:  Understand these instructions.  Will watch your condition.  Will get help right away if you are not doing well or get worse.   This information is not intended to replace advice given to you by your health care provider. Make sure you discuss  any questions you have with your health care provider.   Document Released: 04/25/2005 Document Revised: 04/06/2015 Document Reviewed: 03/25/2013 Elsevier Interactive Patient Education 2016 [employer]  Abdominal Pain, Adult Many things can cause abdominal pain. Usually, abdominal pain is not caused by a disease and will improve without treatment. It can often be observed and treated at home. Your health care provider will do a physical exam and possibly order blood tests and X-rays to help determine the seriousness of your pain. However, in many cases, more time must pass before a clear cause of the pain can be found. Before that point, your health care provider may not know if you need more testing or further treatment. HOME CARE INSTRUCTIONS Monitor your abdominal pain for any changes. The following actions may help to alleviate any discomfort you are experiencing:  Only take over-the-counter or prescription medicines as directed by your health care provider.  Do not take laxatives unless directed to do so by your health care provider.  Try a clear liquid diet (broth, tea, or water) as directed by your health care provider. Slowly move to a bland diet as tolerated. SEEK MEDICAL CARE IF:  You have unexplained abdominal pain.  You have abdominal pain associated with nausea or diarrhea.  You have pain when you urinate or have a bowel movement.  You experience abdominal pain that wakes you in the night.  You have abdominal pain that is worsened or improved by eating food.  You have abdominal  pain that is worsened with eating fatty foods.  You have a fever. SEEK IMMEDIATE MEDICAL CARE IF:  Your pain does not go away within 2 hours.  You keep throwing up (vomiting).  Your pain is felt only in portions of the abdomen, such as the right side or the left lower portion of the abdomen.  You pass bloody or black tarry stools. MAKE SURE YOU:  Understand these instructions.  Will  watch your condition.  Will get help right away if you are not doing well or get worse.   This information is not intended to replace advice given to you by your health care provider. Make sure you discuss any questions you have with your health care provider.   Document Released: 04/25/2005 Document Revised: 04/06/2015 Document Reviewed: 03/25/2013 Elsevier Interactive Patient Education [employer].   The pain you're having may be due to a ruptured cyst. There is a little bit of free fluid inside your pelvis which would go along with that. It's also possible that she may have an infection. I will give you some antibiotics to doxycycline 1 pill twice a day. Please return if you're worse worse pain fever nausea vomiting or feels sicker. Return or see her doctor if not any better in 2 days. For now I would use Motrin 800 mg one pill 3 times a day for the pain.

## 2015-12-29 NOTE — ED Notes (Signed)
Pt brought back to room 12 for low abd cramping - passed a clot last night. On depo x 3 months with no period except to pass the one clot.Emily Levy

## 2015-12-29 NOTE — ED Notes (Signed)
Pt reports that she has been having abdominal cramping today and a large clot came out of her vagina last night. Pt is on depo and has not had a period in 3 months.

## 2015-12-29 NOTE — ED Notes (Signed)
Patient transported to Ultrasound 

## 2016-04-16 ENCOUNTER — Emergency Department: Payer: Self-pay

## 2016-04-16 ENCOUNTER — Encounter: Payer: Self-pay | Admitting: Medical Oncology

## 2016-04-16 ENCOUNTER — Emergency Department
Admission: EM | Admit: 2016-04-16 | Discharge: 2016-04-16 | Disposition: A | Discharge status: Home / Self Care | Payer: Self-pay | Attending: Student in an Organized Health Care Education/Training Program | Admitting: Student in an Organized Health Care Education/Training Program

## 2016-04-16 DIAGNOSIS — Y9289 Other specified places as the place of occurrence of the external cause: Secondary | ICD-10-CM | POA: Insufficient documentation

## 2016-04-16 DIAGNOSIS — Y9389 Activity, other specified: Secondary | ICD-10-CM | POA: Insufficient documentation

## 2016-04-16 DIAGNOSIS — Y999 Unspecified external cause status: Secondary | ICD-10-CM | POA: Insufficient documentation

## 2016-04-16 DIAGNOSIS — S93401A Sprain of unspecified ligament of right ankle, initial encounter: Secondary | ICD-10-CM | POA: Insufficient documentation

## 2016-04-16 DIAGNOSIS — X501XXA Overexertion from prolonged static or awkward postures, initial encounter: Secondary | ICD-10-CM | POA: Insufficient documentation

## 2016-04-16 MED ORDER — MELOXICAM 15 MG PO TABS: 15.0000 mg | ORAL_TABLET | Freq: Every day | ORAL | 0 refills | Status: DC

## 2016-04-16 NOTE — ED Triage Notes (Signed)
Pt injured rt foot about 1 month ago and today she twisted it the wrong way.

## 2016-04-16 NOTE — ED Provider Notes (Signed)
[facility] Emergency Department Provider Note  ____________________________________________  Time seen: Approximately 6:46 PM  I have reviewed the triage vital signs and the nursing notes.   HISTORY  Chief Complaint Foot Pain    HPI Emily Levy is a 25 y.o. female who presents emergency department complaining of right ankle pain. Patient states that she was stepping off a ladder when her foot inverted. Patient states that she is having pain to the lateral aspect of the ankle and to the lateral mid foot. Patient denies any gross edema or deformity. She is able to move the ankle but reports pain. She is able to bear weight. Patient states that she dropped something on her foot a couple weeks ago, did not have it x-rayed or evaluated at that time, the pain is in the same general region so she became concerned that she may have reinjured ankle. No medications prior to arrival. Pain is sharp, constant, worse with movement or weightbearing.   History reviewed. No pertinent past medical history.  Patient Active Problem List   Diagnosis Date Noted  . Sinus tachycardia ([place]) 09/12/2015    Past Surgical History:  Procedure Laterality Date  . HERNIA REPAIR      Prior to Admission medications   Medication Sig Start Date End Date Taking? Authorizing Provider  diphenhydrAMINE (BENADRYL) 25 mg capsule Take 25 mg by mouth every 6 (six) hours as needed for itching or allergies.    Historical Provider, MD  medroxyPROGESTERone (DEPO-PROVERA) 150 MG/ML injection Inject 150 mg into the muscle every 3 (three) months.    Historical Provider, MD  meloxicam (MOBIC) 15 MG tablet Take 1 tablet (15 mg total) by mouth daily. 04/16/16   Charline Bills Cuthriell, PA-C  pramipexole (MIRAPEX) 0.25 MG tablet Take 0.25 mg by mouth 3 (three) times daily.    Historical Provider, MD  triamcinolone ointment (KENALOG) 0.5 % Apply 1 application topically 2 (two) times daily. 12/16/15   Johnn Hai, PA-C    Allergies Sulfa antibiotics  No family history on file.  Social History Social History  Substance Use Topics  . Smoking status: Never Smoker  . Smokeless tobacco: Never Used  . Alcohol use No     Review of Systems  Constitutional: No fever/chills Cardiovascular: no chest pain. Respiratory: no cough. No SOB. Musculoskeletal: Positive for right ankle pain Skin: Negative for rash, abrasions, lacerations, ecchymosis. Neurological: Negative for headaches, focal weakness or numbness. 10-point ROS otherwise negative.  ____________________________________________   PHYSICAL EXAM:  VITAL SIGNS: ED Triage Vitals  Enc Vitals Group     BP 04/16/16 1740 109/76     Pulse Rate 04/16/16 1740 98     Resp 04/16/16 1740 18     Temp 04/16/16 1740 98.2 F (36.8 C)     Temp Source 04/16/16 1740 Oral     SpO2 04/16/16 1740 100 %     Weight 04/16/16 1741 189 lb (85.7 kg)     Height 04/16/16 1741 5\' 5"  (1.651 m)     Head Circumference --      Peak Flow --      Pain Score 04/16/16 1736 7     Pain Loc --      Pain Edu? --      Excl. in [place]? --      Constitutional: Alert and oriented. Well appearing and in no acute distress. Eyes: Conjunctivae are normal. PERRL. EOMI. Head: Atraumatic. Cardiovascular: Normal rate, regular rhythm. Normal S1 and S2.  Good  peripheral circulation. Respiratory: Normal respiratory effort without tachypnea or retractions. Lungs CTAB. Good air entry to the bases with no decreased or absent breath sounds. Musculoskeletal: Full range of motion to all extremities. No gross deformities appreciated.Mild edema noted to the lateral aspect of the right ankle but no deformity. Full range of motion ankle. Patient is tender to palpation over the anterolateral aspect of the ankle. No palpable abnormality. Patient is nontender to palpation over the base of the fifth metatarsal. Patient is tender to palpation over the distribution of the anterior talofibular  ligament. Dorsalis pedis pulse intact. Full range of motion in all digits right foot. Sensation intact 5 digits. Cap refill intact 5 digits. Neurologic:  Normal speech and language. No gross focal neurologic deficits are appreciated.  Skin:  Skin is warm, dry and intact. No rash noted. Psychiatric: Mood and affect are normal. Speech and behavior are normal. Patient exhibits appropriate insight and judgement.   ____________________________________________   LABS (all labs ordered are listed, but only abnormal results are displayed)  Labs Reviewed - No data to display ____________________________________________  EKG   ____________________________________________  RADIOLOGY Diamantina Providence Cuthriell, personally viewed and evaluated these images (plain radiographs) as part of my medical decision making, as well as reviewing the written report by the radiologist.  Dg Foot Complete Right  Result Date: 04/16/2016 CLINICAL DATA:  Pain following twisting injury EXAM: RIGHT FOOT COMPLETE - 3+ VIEW COMPARISON:  None. FINDINGS: Frontal, oblique, and lateral views were obtained. There is no fracture or dislocation. The joint spaces appear normal. No erosive change. IMPRESSION: No fracture or dislocation.  No evident arthropathy. Electronically Signed   By: Lowella Grip III M.D.   On: 04/16/2016 18:49    ____________________________________________    PROCEDURES  Procedure(s) performed:    Procedures    Medications - No data to display   ____________________________________________   INITIAL IMPRESSION / ASSESSMENT AND PLAN / ED COURSE  Pertinent labs & imaging results that were available during my care of the patient were reviewed by me and considered in my medical decision making (see chart for details).  Review of the [place] CSRS was performed in accordance of the [place] prior to dispensing any controlled drugs.  Clinical Course    Patient's diagnosis is consistent with  Right ankle sprain. X-ray reveals no acute osseous abnormality. Exam is reassuring. Patient is given Ace bandage emergency department but declined crutches. Patient is advised to use a stirrup ankle brace purchased over-the-counter.. Patient will be discharged home with prescriptions for anti-inflammatories for symptom control. Patient is to follow up with primary care as needed or otherwise directed. Patient is given ED precautions to return to the ED for any worsening or new symptoms.     ____________________________________________  FINAL CLINICAL IMPRESSION(S) / ED DIAGNOSES  Final diagnoses:  Ankle sprain, right, initial encounter      NEW MEDICATIONS STARTED DURING THIS VISIT:  New Prescriptions   MELOXICAM (MOBIC) 15 MG TABLET    Take 1 tablet (15 mg total) by mouth daily.        This chart was dictated using voice recognition software/Dragon. Despite best efforts to proofread, errors can occur which can change the meaning. Any change was purely unintentional.    Darletta Moll, PA-C 04/16/16 1907    Merlyn Lot, MD 04/17/16 [phone]

## 2016-10-02 ENCOUNTER — Encounter: Payer: Self-pay | Admitting: Obstetrics and Gynecology

## 2016-10-02 ENCOUNTER — Ambulatory Visit (INDEPENDENT_AMBULATORY_CARE_PROVIDER_SITE_OTHER): Payer: BLUE CROSS/BLUE SHIELD | Admitting: Obstetrics and Gynecology

## 2016-10-02 ENCOUNTER — Ambulatory Visit: Payer: BLUE CROSS/BLUE SHIELD

## 2016-10-02 VITALS — BP 118/94 | Ht 65.0 in | Wt 216.0 lb

## 2016-10-02 DIAGNOSIS — K5901 Slow transit constipation: Secondary | ICD-10-CM | POA: Diagnosis not present

## 2016-10-02 DIAGNOSIS — R102 Pelvic and perineal pain: Secondary | ICD-10-CM | POA: Diagnosis not present

## 2016-10-02 DIAGNOSIS — G2581 Restless legs syndrome: Secondary | ICD-10-CM | POA: Insufficient documentation

## 2016-10-02 NOTE — Progress Notes (Signed)
HPI:      [name] is a 26 y.o. G0P0000 who LMP was No LMP recorded. Patient has had an injection., presents today for a problem visit.  She is single partner, contraception - Depo-Provera injections. She is current on annuals with her PCP.  Pt complains of pelvic pain for the past month. Sx are sharp, constant, BLQ. She deneis any fevers, vag sx, urin sx. It does affect her sleep. She only has a BM once wkly but that is normal for pt. She takes tramadol with sx relief. She had similar sx 6/17 and was seen in the ED. She had a neg u/s but was diagnosed with poss ruptured ovar cyst. She followed up with PCP who gave her tramadol. Pt compains of sx again 12/17 with another tramadol Rx. When sx recurred this month, she wanted to f/u with [place]. She did OCPs and depo prior to restarting depo about 5/17 without any pelvic pain sx.   Review of Systems  Constitutional: Negative for fever.  Gastrointestinal: Positive for constipation. Negative for blood in stool, diarrhea, nausea and vomiting.  Genitourinary: Positive for pelvic pain. Negative for dyspareunia, dysuria, flank pain, frequency, hematuria, urgency, vaginal bleeding, vaginal discharge and vaginal pain.  Musculoskeletal: Negative for back pain.  Skin: Negative for rash.    Past Medical History:  Diagnosis Date  . Abnormal Pap smear of cervix    all previous paps except for one in 2016  . Pelvic pain   . Restless leg     Family History  Problem Relation Age of Onset  . Heart disease Maternal Grandmother      OBJECTIVE:   Vitals:  BP (!) 118/94   Ht 5\' 5"  (1.651 m)   Wt 216 lb (98 kg)   BMI 35.94 kg/m   Physical Exam  Abdominal: There is tenderness.  Genitourinary: Uterus is tender. Uterus is not enlarged. Cervix exhibits no motion tenderness and no tenderness. Right adnexum displays no mass and no tenderness. Left adnexum displays no mass and no tenderness. Vulva exhibits no erythema, no exudate, no lesion, no rash  and no tenderness. Vagina exhibits no lesion.    Results: GYN u/s-->EM-8.7 Mm; NEG FF IN CDS; BILAT OVAR WITH FOLLICLES; POSS BLOOD CLOT IN ENDOCX CANAL   Assessment:  Pelvic pain - Neg u/s. Question related to GI constipation given sx hx. Sx may be exacerbated by depo. If sx perist, f/u with PCP and may need to d/c depo.   - Plan: US Transvaginal Non-OB  Constipation by delayed colonic transit - Add fiber/colace/LOTS OF WATER. See if sx improve. F/u with PCP if sx persist.   Plan:    Return if symptoms worsen or fail to improve.  Emily Levy B. Emily Vessel, PA-C 10/02/2016 12:07 PM

## 2017-02-18 IMAGING — DX DG FOOT COMPLETE 3+V*R*
3 series · 3 of 3 positions shown · non-contrast
Comparison: None.

CLINICAL DATA: Pain following twisting injury

EXAM:
RIGHT FOOT COMPLETE - 3+ VIEW

[foot ap]
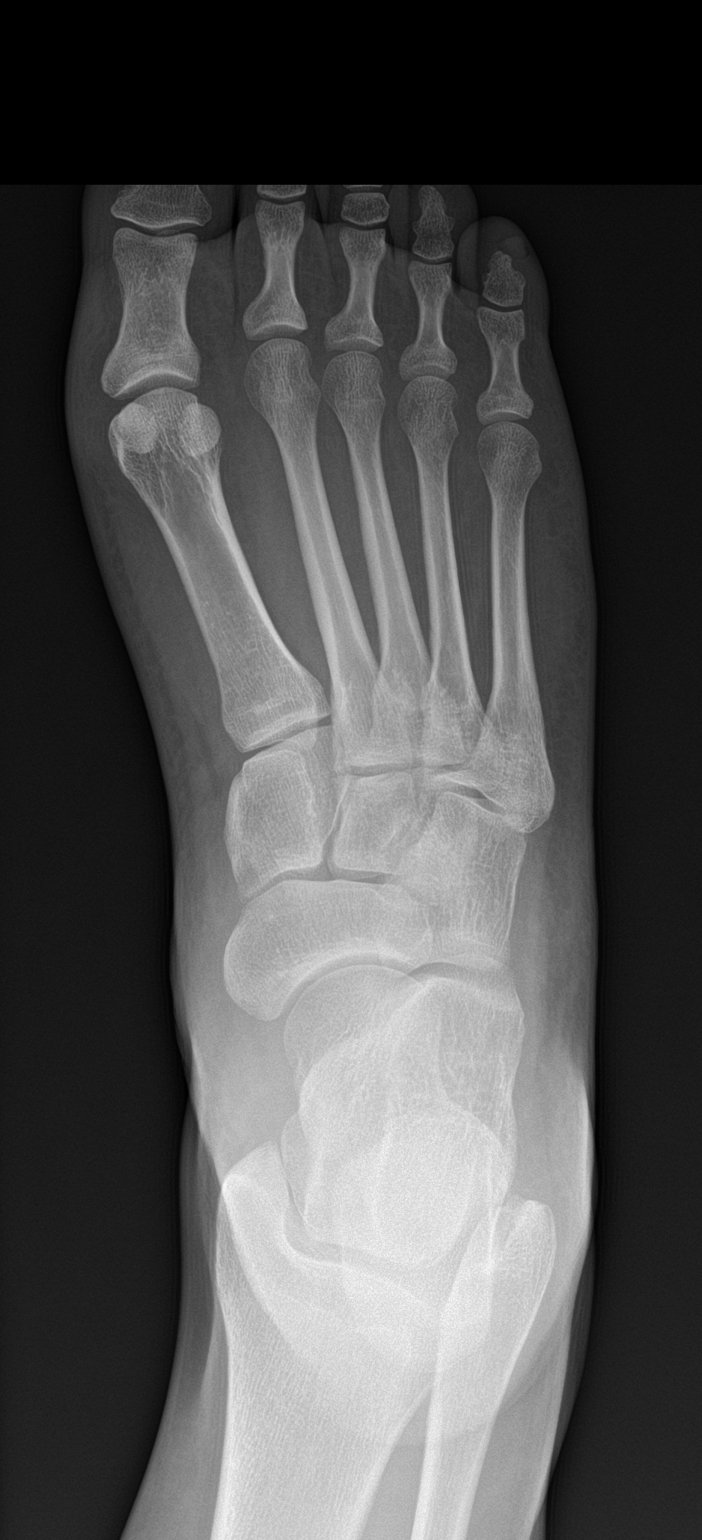

[foot obl]
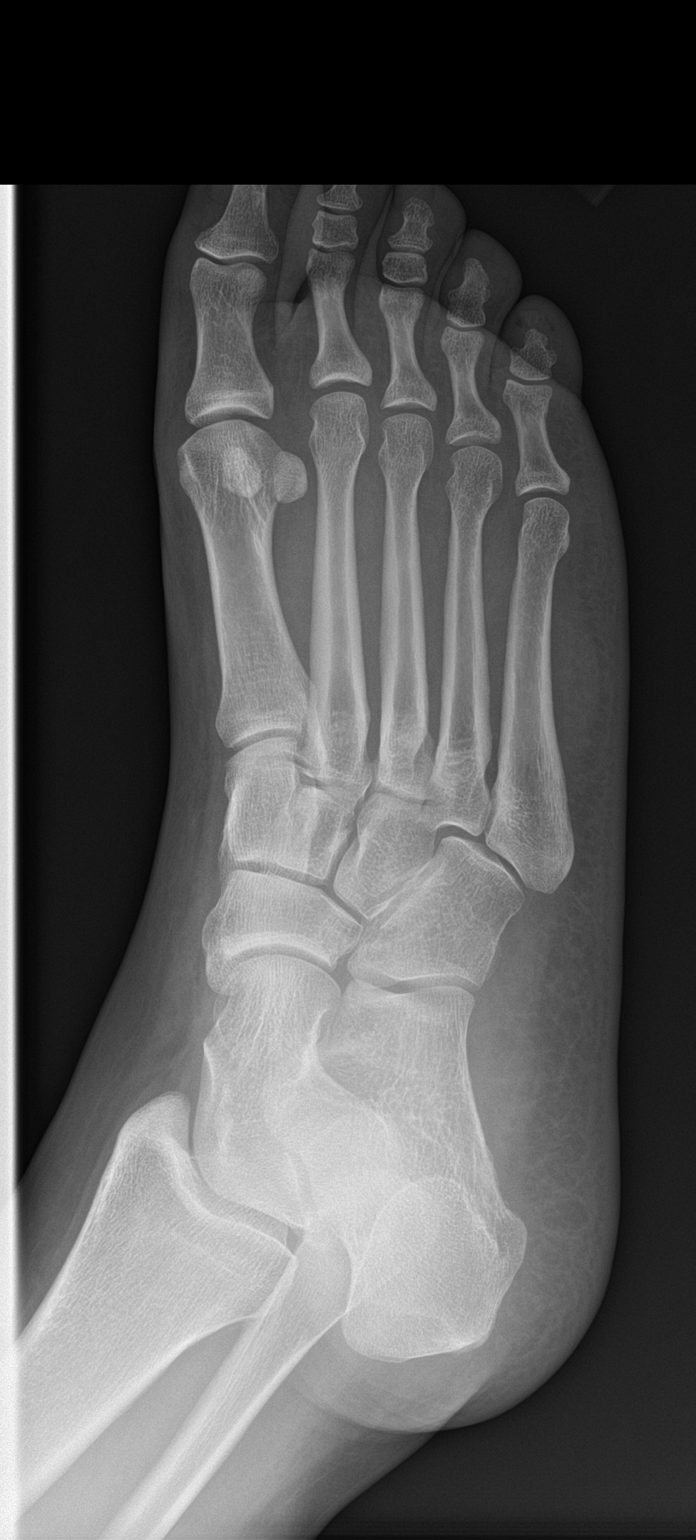

[foot lat]
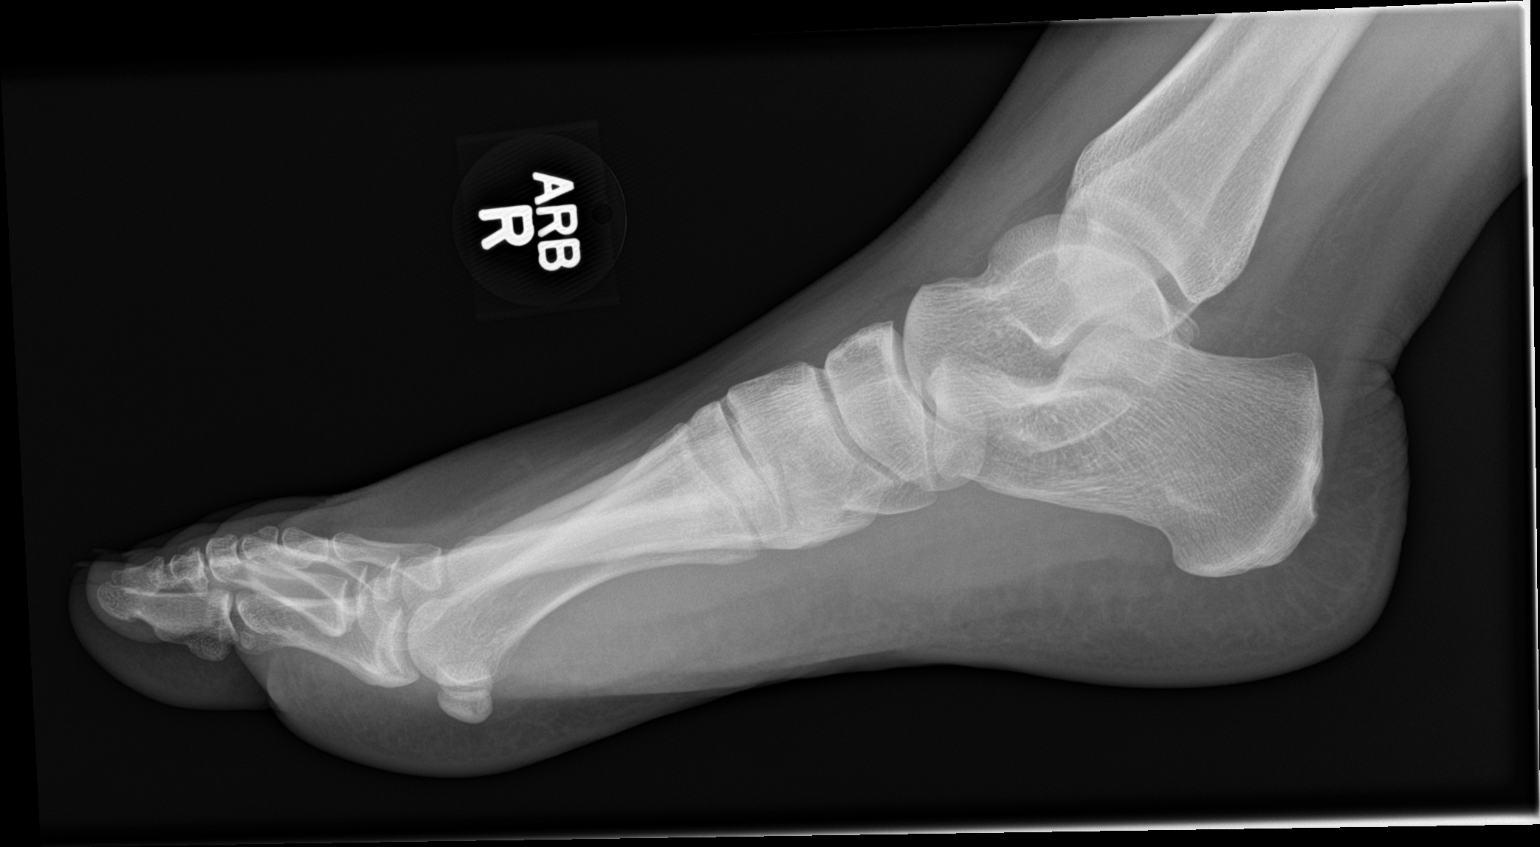

[3 of 3 positions shown; findings below may reference images not displayed]

FINDINGS: Frontal, oblique, and lateral views were obtained. There is no
fracture or dislocation. The joint spaces appear normal. No erosive
change.
IMPRESSION: No fracture or dislocation.  No evident arthropathy.

## 2017-10-15 ENCOUNTER — Other Ambulatory Visit: Payer: Self-pay

## 2017-10-15 ENCOUNTER — Emergency Department: Payer: Self-pay

## 2017-10-15 ENCOUNTER — Encounter: Payer: Self-pay | Admitting: Emergency Medicine

## 2017-10-15 DIAGNOSIS — R079 Chest pain, unspecified: Secondary | ICD-10-CM | POA: Insufficient documentation

## 2017-10-15 DIAGNOSIS — Z79899 Other long term (current) drug therapy: Secondary | ICD-10-CM | POA: Insufficient documentation

## 2017-10-15 DIAGNOSIS — J209 Acute bronchitis, unspecified: Secondary | ICD-10-CM | POA: Insufficient documentation

## 2017-10-15 LAB — CBC
HCT: 34.1 % — ABNORMAL LOW (ref 35.0–47.0)
Hemoglobin: 11.1 g/dL — ABNORMAL LOW (ref 12.0–16.0)
MCH: 25.5 pg — ABNORMAL LOW (ref 26.0–34.0)
MCHC: 32.5 g/dL (ref 32.0–36.0)
MCV: 78.6 fL — AB (ref 80.0–100.0)
PLATELETS: 326 10*3/uL (ref 150–440)
RBC: 4.34 MIL/uL (ref 3.80–5.20)
RDW: 15.4 % — AB (ref 11.5–14.5)
WBC: 10.4 10*3/uL (ref 3.6–11.0)

## 2017-10-15 LAB — BASIC METABOLIC PANEL
Anion gap: 11 (ref 5–15)
BUN: 12 mg/dL (ref 6–20)
CO2: 23 mmol/L (ref 22–32)
CREATININE: 1.1 mg/dL — AB (ref 0.44–1.00)
Calcium: 8.5 mg/dL — ABNORMAL LOW (ref 8.9–10.3)
Chloride: 105 mmol/L (ref 101–111)
GFR calc Af Amer: >60 mL/min (ref >60)
GLUCOSE: 89 mg/dL (ref 65–99)
Potassium: 3.6 mmol/L (ref 3.5–5.1)
SODIUM: 139 mmol/L (ref 135–145)

## 2017-10-15 LAB — TROPONIN I: Troponin I: <0.03 ng/mL (ref <0.03)

## 2017-10-15 NOTE — ED Triage Notes (Addendum)
Patient ambulatory to triage with steady gait, without difficulty or distress noted; pt reports nonprod cough x 2wk; denies congestion, denies fever; pt also c/o left sided CP, nonradiating with no accomp symptoms; denies hx of same

## 2017-10-16 ENCOUNTER — Emergency Department
Admission: EM | Admit: 2017-10-16 | Discharge: 2017-10-16 | Disposition: A | Discharge status: Home / Self Care | Payer: Self-pay | Attending: Emergency Medicine | Admitting: Emergency Medicine

## 2017-10-16 DIAGNOSIS — J209 Acute bronchitis, unspecified: Secondary | ICD-10-CM

## 2017-10-16 MED ORDER — AZITHROMYCIN 500 MG PO TABS: 500.0000 mg | ORAL_TABLET | Freq: Every day | ORAL | 0 refills | Status: AC

## 2017-10-16 MED ORDER — HYDROCOD POLST-CPM POLST ER 10-8 MG/5ML PO SUER: 5.0000 mL | Freq: Two times a day (BID) | ORAL | 0 refills | Status: DC | PRN

## 2017-10-16 MED ORDER — HYDROCOD POLST-CPM POLST ER 10-8 MG/5ML PO SUER
5.0000 mL | Freq: Once | ORAL | Status: AC
  Administered 2017-10-16: 5 mL via ORAL
  Filled 2017-10-16: qty 5

## 2017-10-16 MED ORDER — AZITHROMYCIN 500 MG PO TABS
500.0000 mg | ORAL_TABLET | Freq: Once | ORAL | Status: AC
  Administered 2017-10-16: 500 mg via ORAL
  Filled 2017-10-16: qty 1

## 2017-10-16 NOTE — ED Provider Notes (Signed)
[facility] Emergency Department Provider Note __   First MD Initiated Contact with Patient 10/16/17 0116     (approximate)  I have reviewed the triage vital signs and the nursing notes.   HISTORY  Chief Complaint Cough and Chest Pain   HPI Emily Levy is a 27 y.o. female to the emergency department with a 2-week history of cough and central chest discomfort.  Pain is worse with coughing and palpation.  Patient denies any lower extremity pain or swelling..  Patient has no personal family history of DVT or PE.   Past Medical History:  Diagnosis Date  . Abnormal Pap smear of cervix    all previous paps except for one in 2016  . Pelvic pain   . Restless leg     Patient Active Problem List   Diagnosis Date Noted  . Restless leg syndrome 10/02/2016  . Sinus tachycardia 09/12/2015    Past Surgical History:  Procedure Laterality Date  . APPENDECTOMY  2016  . HERNIA REPAIR      Prior to Admission medications   Medication Sig Start Date End Date Taking? Authorizing Provider  diphenhydrAMINE (BENADRYL) 25 mg capsule Take 25 mg by mouth every 6 (six) hours as needed for itching or allergies.    [provider]  magic mouthwash SOLN Take by mouth. 09/18/15   [provider]  medroxyPROGESTERone (DEPO-PROVERA) 150 MG/ML injection Inject 150 mg into the muscle every 3 (three) months.    [provider]  meloxicam (MOBIC) 15 MG tablet Take 1 tablet (15 mg total) by mouth daily. Patient not taking: Reported on 10/02/2016 04/16/16   Cuthriell, Charline Bills, PA-C  pramipexole (MIRAPEX) 0.25 MG tablet Take 0.25 mg by mouth 3 (three) times daily.    [provider]  triamcinolone ointment (KENALOG) 0.5 % Apply 1 application topically 2 (two) times daily. Patient not taking: Reported on 10/02/2016 12/16/15   Johnn Hai, PA-C    Allergies Sulfa antibiotics  Family History  Problem Relation Age of Onset  . Heart  disease Maternal Grandmother     Social History Social History   Tobacco Use  . Smoking status: Never Smoker  . Smokeless tobacco: Never Used  Substance Use Topics  . Alcohol use: No  . Drug use: No    Review of Systems Constitutional: No fever/chills Eyes: No visual changes. ENT: No sore throat. Cardiovascular: Denies chest pain. Respiratory: Denies shortness of breath. Gastrointestinal: No abdominal pain.  No nausea, no vomiting.  No diarrhea.  No constipation. Genitourinary: Negative for dysuria. Musculoskeletal: Negative for neck pain.  Negative for back pain. Integumentary: Negative for rash. Neurological: Negative for headaches, focal weakness or numbness.  ____________________________________________   PHYSICAL EXAM:  VITAL SIGNS: ED Triage Vitals  Enc Vitals Group     BP 10/15/17 2006 123/75     Pulse Rate 10/15/17 2006 100     Resp 10/15/17 2006 18     Temp 10/15/17 2006 98.4 F (36.9 C)     Temp Source 10/15/17 2006 Oral     SpO2 10/15/17 2006 100 %     Weight 10/15/17 2005 106.1 kg (234 lb)     Height 10/15/17 2005 1.651 m (5\' 5" )     Head Circumference --      Peak Flow --      Pain Score 10/15/17 2005 8     Pain Loc --      Pain Edu? --  Excl. in [place]? --     Constitutional: Alert and oriented.  Actively coughing eyes: Conjunctivae are normal.  Head: Atraumatic. Mouth/Throat: Mucous membranes are moist.  Oropharynx non-erythematous. Neck: No stridor.   Cardiovascular: Normal rate, regular rhythm. Good peripheral circulation. Grossly normal heart sounds. Respiratory: Normal respiratory effort.  No retractions. Lungs CTAB. Gastrointestinal: Soft and nontender. No distention.  Musculoskeletal: No lower extremity tenderness nor edema. No gross deformities of extremities. Neurologic:  Normal speech and language. No gross focal neurologic deficits are appreciated.  Skin:  Skin is warm, dry and intact. No rash noted. Psychiatric: Mood and affect  are normal. Speech and behavior are normal.  ____________________________________________   LABS (all labs ordered are listed, but only abnormal results are displayed)  Labs Reviewed  BASIC METABOLIC PANEL - Abnormal; Notable for the following components:      Result Value   Creatinine, Ser 1.10 (*)    Calcium 8.5 (*)    All other components within normal limits  CBC - Abnormal; Notable for the following components:   Hemoglobin 11.1 (*)    HCT 34.1 (*)    MCV 78.6 (*)    MCH 25.5 (*)    RDW 15.4 (*)    All other components within normal limits  TROPONIN I    RADIOLOGY I, [place] Hollister Wessler, personally viewed and evaluated these images (plain radiographs) as part of my medical decision making, as well as reviewing the written report by the radiologist.    Official radiology report(s): Dg Chest 2 View  Result Date: 10/15/2017 CLINICAL DATA:  Nonradiating LEFT chest pain, nonproductive cough for 2 weeks EXAM: CHEST - 2 VIEW COMPARISON:  09/12/2015 FINDINGS: Upper normal heart size. Mediastinal contours and pulmonary vascularity normal. Lungs clear. No pleural effusion or pneumothorax. Bones unremarkable. IMPRESSION: No acute abnormalities. Electronically Signed   By: Lavonia Dana M.D.   On: 10/15/2017 20:18      Procedures   ____________________________________________   INITIAL IMPRESSION / ASSESSMENT AND PLAN / ED COURSE  As part of my medical decision making, I reviewed the following data within the electronic MEDICAL RECORD NUMBER 27 year old female presented with above-stated history and physical exam secondary to persistent cough times 2 weeks.  Considered possibly of acute bronchitis versus pneumonia and as such chest x-ray was performed which revealed no evidence of pneumonia.  Also consider the possibility of whooping cough as there is been a outbreak in [place].  Patient given Tussionex and azithromycin in the emergency department will be prescribed the same for  home.  Spoke with the patient and her mother at length regarding the need to follow-up if symptoms not resolved following appropriate treatment.   ____________________________________________  FINAL CLINICAL IMPRESSION(S) / ED DIAGNOSES  Final diagnoses:  Acute bronchitis, unspecified organism     MEDICATIONS GIVEN DURING THIS VISIT:  Medications  chlorpheniramine-HYDROcodone (TUSSIONEX) 10-8 MG/5ML suspension 5 mL (not administered)  azithromycin (ZITHROMAX) tablet 500 mg (not administered)     ED Discharge Orders    None       Note:  This document was prepared using Dragon voice recognition software and may include unintentional dictation errors.    Gregor Hams, MD 10/16/17 [phone]

## 2017-10-16 NOTE — ED Notes (Signed)

## 2017-12-12 ENCOUNTER — Encounter: Payer: Self-pay | Admitting: Emergency Medicine

## 2017-12-12 ENCOUNTER — Other Ambulatory Visit: Payer: Self-pay

## 2017-12-12 ENCOUNTER — Emergency Department
Admission: EM | Admit: 2017-12-12 | Discharge: 2017-12-12 | Disposition: A | Discharge status: Home / Self Care | Payer: Self-pay | Attending: Emergency Medicine | Admitting: Emergency Medicine

## 2017-12-12 DIAGNOSIS — J029 Acute pharyngitis, unspecified: Secondary | ICD-10-CM | POA: Insufficient documentation

## 2017-12-12 DIAGNOSIS — Z79899 Other long term (current) drug therapy: Secondary | ICD-10-CM | POA: Insufficient documentation

## 2017-12-12 MED ORDER — AMOXICILLIN 500 MG PO TABS: 500.0000 mg | ORAL_TABLET | Freq: Three times a day (TID) | ORAL | 0 refills | Status: DC

## 2017-12-12 NOTE — ED Provider Notes (Signed)
[facility] Emergency Department Provider Note  ____________________________________________  Time seen: Approximately 12:59 PM  I have reviewed the triage vital signs and the nursing notes.   HISTORY  Chief Complaint Sore Throat    HPI Emily Levy is a 27 y.o. female who presents the emergency department for evaluation and treatment of sore throat that is been present for the past week.  She was evaluated by her primary care provider 2 days ago for the same and was advised to take Singulair.  Sore throat has worsened.  She denies fever or other symptoms of concern. Past Medical History:  Diagnosis Date  . Abnormal Pap smear of cervix    all previous paps except for one in 2016  . Pelvic pain   . Restless leg     Patient Active Problem List   Diagnosis Date Noted  . Restless leg syndrome 10/02/2016  . Sinus tachycardia 09/12/2015    Past Surgical History:  Procedure Laterality Date  . APPENDECTOMY  2016  . HERNIA REPAIR      Prior to Admission medications   Medication Sig Start Date End Date Taking? Authorizing Provider  amoxicillin (AMOXIL) 500 MG tablet Take 1 tablet (500 mg total) by mouth 3 (three) times daily. 12/12/17   Sakeena Teall, Johnette Abraham B, FNP  chlorpheniramine-HYDROcodone (TUSSIONEX PENNKINETIC ER) 10-8 MG/5ML SUER Take 5 mLs by mouth every 12 (twelve) hours as needed. 10/16/17   Gregor Hams, MD  diphenhydrAMINE (BENADRYL) 25 mg capsule Take 25 mg by mouth every 6 (six) hours as needed for itching or allergies.    [provider]  magic mouthwash SOLN Take by mouth. 09/18/15   [provider]  medroxyPROGESTERone (DEPO-PROVERA) 150 MG/ML injection Inject 150 mg into the muscle every 3 (three) months.    [provider]  meloxicam (MOBIC) 15 MG tablet Take 1 tablet (15 mg total) by mouth daily. Patient not taking: Reported on 10/02/2016 04/16/16   Cuthriell, Charline Bills, PA-C  pramipexole (MIRAPEX) 0.25 MG  tablet Take 0.25 mg by mouth 3 (three) times daily.    [provider]  triamcinolone ointment (KENALOG) 0.5 % Apply 1 application topically 2 (two) times daily. Patient not taking: Reported on 10/02/2016 12/16/15   Johnn Hai, PA-C    Allergies Sulfa antibiotics  Family History  Problem Relation Age of Onset  . Heart disease Maternal Grandmother     Social History Social History   Tobacco Use  . Smoking status: Never Smoker  . Smokeless tobacco: Never Used  Substance Use Topics  . Alcohol use: No  . Drug use: No    Review of Systems Constitutional: Negative for fever. Eyes: No visual changes. ENT: Positive for sore throat; negative for difficulty swallowing. Respiratory: Denies shortness of breath. Gastrointestinal: No abdominal pain.  No nausea, no vomiting.  No diarrhea.  Genitourinary: Negative for dysuria. Musculoskeletal: Negative for generalized body aches. Skin: Negative for rash. Neurological: Positive for headaches, negative focal weakness or numbness.  ____________________________________________   PHYSICAL EXAM:  VITAL SIGNS: ED Triage Vitals  Enc Vitals Group     BP 12/12/17 1248 102/70     Pulse Rate 12/12/17 1248 (!) 102     Resp 12/12/17 1248 18     Temp 12/12/17 1248 98.3 F (36.8 C)     Temp Source 12/12/17 1248 Oral     SpO2 12/12/17 1248 96 %     Weight 12/12/17 1243 240 lb (108.9 kg)     Height 12/12/17  1243 5\' 3"  (1.6 m)     Head Circumference --      Peak Flow --      Pain Score 12/12/17 1242 8     Pain Loc --      Pain Edu? --      Excl. in [place]? --    Constitutional: Alert and oriented. Well appearing and in no acute distress. Eyes: Conjunctivae are normal.  Head: Atraumatic. Nose: No congestion/rhinnorhea. Mouth/Throat: Mucous membranes are moist.  Oropharynx erythematous, tonsils 1+ with exudate. Uvula is midline. Neck: No stridor.  Lymphatic: Lymphadenopathy: No anterior cervical lymphadenopathy on  exam Cardiovascular: Normal rate, regular rhythm. Good peripheral circulation. Respiratory: Normal respiratory effort. Lungs CTAB. Gastrointestinal: Soft and nontender. Musculoskeletal: No lower extremity tenderness nor edema.  Neurologic:  Normal speech and language. No gross focal neurologic deficits are appreciated. Speech is normal. No gait instability. Skin:  Skin is warm, dry and intact. No rash noted Psychiatric: Mood and affect are normal. Speech and behavior are normal.  ____________________________________________   LABS (all labs ordered are listed, but only abnormal results are displayed)  Labs Reviewed - No data to display ____________________________________________  EKG  Not indicated ____________________________________________  RADIOLOGY  Not indicated  ____________________________________________   PROCEDURES  Procedure(s) performed: None  Critical Care performed: No ____________________________________________   INITIAL IMPRESSION / ASSESSMENT AND PLAN / ED COURSE  27 year old female presenting to the emergency department for treatment and evaluation of sore throat.  Symptoms and exam are concerning with streptococcal pharyngitis.  She will be placed on amoxicillin and encouraged to continue taking ibuprofen or Tylenol if needed for pain or fever.  She was advised to follow-up with her primary care provider for symptoms of concern if she is unable to schedule an appointment she is to return to the emergency department.  Work excuse was provided for today.  Pertinent labs & imaging results that were available during my care of the patient were reviewed by me and considered in my medical decision making (see chart for details). ____________________________________________  Discharge Medication List as of 12/12/2017  1:25 PM    START taking these medications   Details  amoxicillin (AMOXIL) 500 MG tablet Take 1 tablet (500 mg total) by mouth 3 (three)  times daily., Starting Thu 12/12/2017, Print        FINAL CLINICAL IMPRESSION(S) / ED DIAGNOSES  Final diagnoses:  Pharyngitis, unspecified etiology    If controlled substance prescribed during this visit, 12 month history viewed on the [place] prior to issuing an initial prescription for Schedule II or III opiod.   Note:  This document was prepared using Dragon voice recognition software and may include unintentional dictation errors.    Victorino Dike, FNP 12/12/17 1347    Lavonia Drafts, MD 12/12/17 1422

## 2017-12-12 NOTE — ED Triage Notes (Signed)
Sore throat x 1 week.  Seen by PCP Tuesday for same, given singulair.  States symptoms worse.

## 2018-01-07 ENCOUNTER — Other Ambulatory Visit: Payer: Self-pay

## 2018-01-07 ENCOUNTER — Emergency Department: Payer: Self-pay

## 2018-01-07 ENCOUNTER — Emergency Department
Admission: EM | Admit: 2018-01-07 | Discharge: 2018-01-07 | Disposition: A | Discharge status: Home / Self Care | Payer: Self-pay | Attending: Emergency Medicine | Admitting: Emergency Medicine

## 2018-01-07 ENCOUNTER — Encounter: Payer: Self-pay | Admitting: Emergency Medicine

## 2018-01-07 DIAGNOSIS — R112 Nausea with vomiting, unspecified: Secondary | ICD-10-CM | POA: Insufficient documentation

## 2018-01-07 DIAGNOSIS — R109 Unspecified abdominal pain: Secondary | ICD-10-CM

## 2018-01-07 DIAGNOSIS — R1084 Generalized abdominal pain: Secondary | ICD-10-CM | POA: Insufficient documentation

## 2018-01-07 HISTORY — DX: Unspecified asthma, uncomplicated: J45.909

## 2018-01-07 LAB — COMPREHENSIVE METABOLIC PANEL
ALT: 9 U/L — ABNORMAL LOW (ref 14–54)
ANION GAP: 7 (ref 5–15)
AST: 15 U/L (ref 15–41)
Albumin: 3.7 g/dL (ref 3.5–5.0)
Alkaline Phosphatase: 48 U/L (ref 38–126)
BUN: 11 mg/dL (ref 6–20)
CALCIUM: 8.7 mg/dL — AB (ref 8.9–10.3)
CHLORIDE: 105 mmol/L (ref 101–111)
CO2: 24 mmol/L (ref 22–32)
Creatinine, Ser: 0.93 mg/dL (ref 0.44–1.00)
GFR calc non Af Amer: >60 mL/min (ref >60)
Glucose, Bld: 98 mg/dL (ref 65–99)
Potassium: 4.1 mmol/L (ref 3.5–5.1)
SODIUM: 136 mmol/L (ref 135–145)
Total Bilirubin: 0.5 mg/dL (ref 0.3–1.2)
Total Protein: 7.7 g/dL (ref 6.5–8.1)

## 2018-01-07 LAB — CBC
HCT: 35.3 % (ref 35.0–47.0)
HEMOGLOBIN: 11.5 g/dL — AB (ref 12.0–16.0)
MCH: 25.2 pg — AB (ref 26.0–34.0)
MCHC: 32.6 g/dL (ref 32.0–36.0)
MCV: 77.2 fL — AB (ref 80.0–100.0)
Platelets: 333 10*3/uL (ref 150–440)
RBC: 4.57 MIL/uL (ref 3.80–5.20)
RDW: 16.2 % — ABNORMAL HIGH (ref 11.5–14.5)
WBC: 8.2 10*3/uL (ref 3.6–11.0)

## 2018-01-07 LAB — URINALYSIS, COMPLETE (UACMP) WITH MICROSCOPIC
BILIRUBIN URINE: NEGATIVE
Bacteria, UA: NONE SEEN
GLUCOSE, UA: NEGATIVE mg/dL
Hgb urine dipstick: NEGATIVE
KETONES UR: NEGATIVE mg/dL
Leukocytes, UA: NEGATIVE
Nitrite: NEGATIVE
PROTEIN: NEGATIVE mg/dL
Specific Gravity, Urine: 1.018 (ref 1.005–1.030)
pH: 5 (ref 5.0–8.0)

## 2018-01-07 LAB — POCT PREGNANCY, URINE: Preg Test, Ur: NEGATIVE

## 2018-01-07 LAB — LIPASE, BLOOD: LIPASE: 42 U/L (ref 11–51)

## 2018-01-07 MED ORDER — ONDANSETRON HCL 4 MG/2ML IJ SOLN
4.0000 mg | Freq: Once | INTRAMUSCULAR | Status: AC
  Administered 2018-01-07: 4 mg via INTRAVENOUS
  Filled 2018-01-07: qty 2

## 2018-01-07 MED ORDER — SODIUM CHLORIDE 0.9 % IV BOLUS
1000.0000 mL | Freq: Once | INTRAVENOUS | Status: AC
  Administered 2018-01-07: 1000 mL via INTRAVENOUS

## 2018-01-07 MED ORDER — HYDROCODONE-ACETAMINOPHEN 5-325 MG PO TABS: 1.0000 | ORAL_TABLET | ORAL | 0 refills | Status: DC | PRN

## 2018-01-07 MED ORDER — IOPAMIDOL (ISOVUE-300) INJECTION 61%
30.0000 mL | Freq: Once | INTRAVENOUS | Status: AC | PRN
  Administered 2018-01-07: 30 mL via ORAL

## 2018-01-07 MED ORDER — MORPHINE SULFATE (PF) 4 MG/ML IV SOLN
4.0000 mg | Freq: Once | INTRAVENOUS | Status: AC
  Administered 2018-01-07: 4 mg via INTRAVENOUS
  Filled 2018-01-07: qty 1

## 2018-01-07 MED ORDER — ONDANSETRON 4 MG PO TBDP: 4.0000 mg | ORAL_TABLET | Freq: Three times a day (TID) | ORAL | 0 refills | Status: DC | PRN

## 2018-01-07 MED ORDER — IOHEXOL 300 MG/ML  SOLN
100.0000 mL | Freq: Once | INTRAMUSCULAR | Status: AC | PRN
  Administered 2018-01-07: 100 mL via INTRAVENOUS

## 2018-01-07 MED ORDER — IOPAMIDOL (ISOVUE-300) INJECTION 61%: 15.0000 mL | INTRAVENOUS | Status: DC

## 2018-01-07 NOTE — ED Notes (Signed)
Patient transported to CT 

## 2018-01-07 NOTE — ED Triage Notes (Signed)
Pt arrived via POV with reports of generalized abdominal pain that started yesterday. Pt states the pain is all over and states it's not sharp, rather that it is just pain.  Pt c/o nausea as well, but no vomiting.

## 2018-01-07 NOTE — ED Triage Notes (Signed)
Pt reports generalized abdominal pain for a couple of days. States the pain is constant.

## 2018-01-07 NOTE — ED Notes (Signed)
Pt ambulatory upon discharge but preferred wheel chair. Pt and mother verbalized understanding of discharge instructions, follow-up care and prescriptions. VSS. Skin warm and dry. A&O x4.

## 2018-01-07 NOTE — ED Provider Notes (Signed)
[facility] Emergency Department Provider Note  Time seen: 10:07 AM  I have reviewed the triage vital signs and the nursing notes.   HISTORY  Chief Complaint Abdominal Pain and Emesis    HPI Emily Levy is a 27 y.o. female with no significant past medical history presents to the emergency department for 10 out of 10 abdominal pain.  According to the patient she developed abdominal pain yesterday, states it is generalized throughout her entire abdomen has been constant.  States she is nauseated but denies any vomiting or diarrhea.  Normal bowel movement yesterday.  Denies dysuria vaginal discharge or bleeding.  Patient states the abdomen is diffusely hurting, states she cannot pinpoint a certain area of pain.   Past Medical History:  Diagnosis Date  . Abnormal Pap smear of cervix    all previous paps except for one in 2016  . Pelvic pain   . Restless leg     Patient Active Problem List   Diagnosis Date Noted  . Restless leg syndrome 10/02/2016  . Sinus tachycardia 09/12/2015    Past Surgical History:  Procedure Laterality Date  . APPENDECTOMY  2016  . HERNIA REPAIR      Prior to Admission medications   Medication Sig Start Date End Date Taking? Authorizing Provider  amoxicillin (AMOXIL) 500 MG tablet Take 1 tablet (500 mg total) by mouth 3 (three) times daily. Patient not taking: Reported on 01/07/2018 12/12/17   Victorino Dike, FNP    Allergies  Allergen Reactions  . Sulfa Antibiotics Hives    Family History  Problem Relation Age of Onset  . Heart disease Maternal Grandmother     Social History Social History   Tobacco Use  . Smoking status: Never Smoker  . Smokeless tobacco: Never Used  Substance Use Topics  . Alcohol use: No  . Drug use: No    Review of Systems Constitutional: Negative for fever. Eyes: Negative for visual complaints ENT: Negative for recent illness/congestion Cardiovascular: Negative for chest  pain. Respiratory: Negative for shortness of breath. Gastrointestinal: 10/10 diffuse abdominal pain.  Positive for nausea.  Negative for vomiting or diarrhea Genitourinary: Negative for urinary compaints.  Negative for vaginal discharge or bleeding. Musculoskeletal: Negative for musculoskeletal complaints Skin: Negative for skin complaints  Neurological: Negative for headache All other ROS negative  ____________________________________________   PHYSICAL EXAM:  VITAL SIGNS: ED Triage Vitals  Enc Vitals Group     BP 01/07/18 0828 98/62     Pulse Rate 01/07/18 0828 82     Resp 01/07/18 0828 16     Temp 01/07/18 0828 99.1 F (37.3 C)     Temp Source 01/07/18 0828 Oral     SpO2 01/07/18 0828 98 %     Weight 01/07/18 0829 234 lb (106.1 kg)     Height 01/07/18 0829 5\' 4"  (1.626 m)     Head Circumference --      Peak Flow --      Pain Score 01/07/18 0829 10     Pain Loc --      Pain Edu? --      Excl. in [place]? --     Constitutional: Alert and oriented.  Seems somewhat uncomfortable lying in bed. Eyes: Normal exam ENT   Head: Normocephalic and atraumatic.   Mouth/Throat: Mucous membranes are moist. Cardiovascular: Normal rate, regular rhythm. No murmur Respiratory: Normal respiratory effort without tachypnea nor retractions. Breath sounds are clear Gastrointestinal: Soft, states mild tenderness throughout the abdomen.  No reaction to abdominal palpation.  Patient status post appendectomy.  Specifically no right lower quadrant or right upper quadrant significant tenderness on exam. Musculoskeletal: Nontender with normal range of motion in all extremities. No lower extremity tenderness or edema. Neurologic:  Normal speech and language. No gross focal neurologic deficits  Skin:  Skin is warm, dry and intact.  Psychiatric: Mood and affect are normal.  ____________________________________________   RADIOLOGY  CT  negative  ____________________________________________   INITIAL IMPRESSION / ASSESSMENT AND PLAN / ED COURSE  Pertinent labs & imaging results that were available during my care of the patient were reviewed by me and considered in my medical decision making (see chart for details).  Patient presents to the emergency department for abdominal pain which she states is a 10/10 and diffuse throughout the entire abdomen constant since yesterday.  States nausea but denies vomiting.  Differential is quite broad but would include colitis, diverticulitis, gallbladder disease, pancreatitis, urinary tract infection.  Lab work has resulted largely within normal limits, normal white blood cell count, normal LFTs, normal kidney function, normal lipase.  Urinalysis and POC pregnancy test pending.  I discussed with the patient the pros and cons of CT imaging, however as the patient states she continues to be in 10 out of 10 abdominal pain diffusely throughout the entire abdomen we will proceed with CT imaging to further evaluate.  Patient agreeable to plan of care.  We will treat the patient's pain and nausea, IV hydrate while awaiting CT imaging.  CT negative.  Labs are reassuring.  No acute findings to explain the patient's abdominal discomfort.  States abdominal pain is improved but remains present.  We will discharge with a short course of pain and nausea medication of the patient follow-up with her doctor.  I discussed return precautions for any worsening pain or development of fever.  Patient agreeable to plan of care.  ____________________________________________   FINAL CLINICAL IMPRESSION(S) / ED DIAGNOSES  Abdominal pain Nausea    Harvest Dark, MD 01/07/18 1327

## 2018-01-07 NOTE — ED Notes (Signed)
Pt unable to provide urine specimen at this time

## 2018-03-19 ENCOUNTER — Other Ambulatory Visit: Payer: Self-pay

## 2018-03-19 ENCOUNTER — Emergency Department
Admission: EM | Admit: 2018-03-19 | Discharge: 2018-03-19 | Disposition: A | Discharge status: Home / Self Care | Payer: Self-pay | Attending: Student in an Organized Health Care Education/Training Program | Admitting: Student in an Organized Health Care Education/Training Program

## 2018-03-19 DIAGNOSIS — Z79899 Other long term (current) drug therapy: Secondary | ICD-10-CM | POA: Insufficient documentation

## 2018-03-19 DIAGNOSIS — J45909 Unspecified asthma, uncomplicated: Secondary | ICD-10-CM | POA: Insufficient documentation

## 2018-03-19 DIAGNOSIS — R55 Syncope and collapse: Secondary | ICD-10-CM | POA: Insufficient documentation

## 2018-03-19 DIAGNOSIS — Z3202 Encounter for pregnancy test, result negative: Secondary | ICD-10-CM | POA: Insufficient documentation

## 2018-03-19 LAB — URINALYSIS, COMPLETE (UACMP) WITH MICROSCOPIC
Bacteria, UA: NONE SEEN
Bilirubin Urine: NEGATIVE
GLUCOSE, UA: NEGATIVE mg/dL
Hgb urine dipstick: NEGATIVE
Ketones, ur: 5 mg/dL — AB
Leukocytes, UA: NEGATIVE
Nitrite: NEGATIVE
PH: 5 (ref 5.0–8.0)
Protein, ur: NEGATIVE mg/dL
Specific Gravity, Urine: 1.027 (ref 1.005–1.030)

## 2018-03-19 LAB — CBC
HCT: 34.4 % — ABNORMAL LOW (ref 35.0–47.0)
Hemoglobin: 11.3 g/dL — ABNORMAL LOW (ref 12.0–16.0)
MCH: 25.4 pg — AB (ref 26.0–34.0)
MCHC: 32.9 g/dL (ref 32.0–36.0)
MCV: 77.4 fL — AB (ref 80.0–100.0)
Platelets: 338 10*3/uL (ref 150–440)
RBC: 4.44 MIL/uL (ref 3.80–5.20)
RDW: 15.9 % — AB (ref 11.5–14.5)
WBC: 9.6 10*3/uL (ref 3.6–11.0)

## 2018-03-19 LAB — BASIC METABOLIC PANEL
Anion gap: 7 (ref 5–15)
BUN: 10 mg/dL (ref 6–20)
CALCIUM: 8.6 mg/dL — AB (ref 8.9–10.3)
CO2: 28 mmol/L (ref 22–32)
CREATININE: 0.87 mg/dL (ref 0.44–1.00)
Chloride: 101 mmol/L (ref 98–111)
GFR calc non Af Amer: >60 mL/min (ref >60)
GLUCOSE: 86 mg/dL (ref 70–99)
Potassium: 3.7 mmol/L (ref 3.5–5.1)
Sodium: 136 mmol/L (ref 135–145)

## 2018-03-19 LAB — POC URINE PREG, ED: Preg Test, Ur: NEGATIVE

## 2018-03-19 LAB — HCG, QUANTITATIVE, PREGNANCY: hCG, Beta Chain, Quant, S: <1 m[IU]/mL (ref <5)

## 2018-03-19 LAB — GLUCOSE, CAPILLARY: GLUCOSE-CAPILLARY: 78 mg/dL (ref 70–99)

## 2018-03-19 MED ORDER — SODIUM CHLORIDE 0.9 % IV BOLUS
1000.0000 mL | Freq: Once | INTRAVENOUS | Status: AC
  Administered 2018-03-19: 1000 mL via INTRAVENOUS

## 2018-03-19 NOTE — ED Triage Notes (Signed)
To ER via POV c/o dizziness that began yesterday and two episodes of syncope since.  Pt denies hitting head during syncopal episodes. Pt had family with her. States she continues to feel dizzy.  Hx of syncope "years ago".  Pt alert and oriented X4, active, cooperative, pt in NAD. RR even and unlabored, color WNL.

## 2018-03-19 NOTE — ED Provider Notes (Signed)
[facility] Emergency Department Provider Note    First MD Initiated Contact with Patient 03/19/18 1405     (approximate)  I have reviewed the triage vital signs and the nursing notes.   HISTORY  Chief Complaint Loss of Consciousness and Dizziness    HPI Emily Levy is a 27 y.o. female presents the ER with 2 syncopal episodes.  One happening last night while she was working she was about to get up to go get a drink of water and felt lightheaded.  Did not fall and hit her head.  No episode occurred this morning while she was at home washing the dishes.  She been standing up and started feeling lightheaded.  Denies any palpitations.  No chest pain.  States she is had a poor appetite for long time.  Has had a history of syncopal episodes in the past.  Has worn a Holter monitor that was reassuring previously.  Denies any chance of being pregnant.  Last mental cycle was 2 weeks ago.  Denies any nausea or vomiting.  Denies any chest pain or shortness of breath.  Has not noted any leg swelling.  No family history of sudden cardiac death.    Past Medical History:  Diagnosis Date  . Abnormal Pap smear of cervix    all previous paps except for one in 2016  . Asthma   . Pelvic pain   . Restless leg    Family History  Problem Relation Age of Onset  . Heart disease Maternal Grandmother    Past Surgical History:  Procedure Laterality Date  . APPENDECTOMY  2016  . HERNIA REPAIR     Patient Active Problem List   Diagnosis Date Noted  . Restless leg syndrome 10/02/2016  . Sinus tachycardia 09/12/2015      Prior to Admission medications   Medication Sig Start Date End Date Taking? Authorizing Provider  amoxicillin (AMOXIL) 500 MG tablet Take 1 tablet (500 mg total) by mouth 3 (three) times daily. Patient not taking: Reported on 01/07/2018 12/12/17   Sherrie George B, FNP  HYDROcodone-acetaminophen (NORCO/VICODIN) 5-325 MG tablet Take 1 tablet by mouth  every 4 (four) hours as needed. 01/07/18   Harvest Dark, MD  ondansetron (ZOFRAN ODT) 4 MG disintegrating tablet Take 1 tablet (4 mg total) by mouth every 8 (eight) hours as needed for nausea or vomiting. 01/07/18   Harvest Dark, MD    Allergies Sulfa antibiotics    Social History Social History   Tobacco Use  . Smoking status: Never Smoker  . Smokeless tobacco: Never Used  Substance Use Topics  . Alcohol use: No  . Drug use: No    Review of Systems Patient denies headaches, rhinorrhea, blurry vision, numbness, shortness of breath, chest pain, edema, cough, abdominal pain, nausea, vomiting, diarrhea, dysuria, fevers, rashes or hallucinations unless otherwise stated above in HPI. ____________________________________________   PHYSICAL EXAM:  VITAL SIGNS: Vitals:   03/19/18 1153  BP: 99/62  Pulse: 78  Resp: 18  Temp: 98.8 F (37.1 C)  SpO2: 99%    Constitutional: Alert and oriented.  Eyes: Conjunctivae are normal.  Head: Atraumatic. Nose: No congestion/rhinnorhea. Mouth/Throat: Mucous membranes are moist.   Neck: No stridor. Painless ROM.  Cardiovascular: Normal rate, regular rhythm. Grossly normal heart sounds.  Good peripheral circulation. Respiratory: Normal respiratory effort.  No retractions. Lungs CTAB. Gastrointestinal: Soft and nontender. No distention. No abdominal bruits. No CVA tenderness. Genitourinary:  Musculoskeletal: No lower extremity tenderness nor edema.  No joint effusions. Neurologic:  Normal speech and language. No gross focal neurologic deficits are appreciated. No facial droop Skin:  Skin is warm, dry and intact. No rash noted. Psychiatric: Mood and affect are normal. Speech and behavior are normal.  ____________________________________________   LABS (all labs ordered are listed, but only abnormal results are displayed)  Results for orders placed or performed during the hospital encounter of 03/19/18 (from the past 24 hour(s))   Basic metabolic panel     Status: Abnormal   Collection Time: 03/19/18 11:58 AM  Result Value Ref Range   Sodium 136 135 - 145 mmol/L   Potassium 3.7 3.5 - 5.1 mmol/L   Chloride 101 98 - 111 mmol/L   CO2 28 22 - 32 mmol/L   Glucose, Bld 86 70 - 99 mg/dL   BUN 10 6 - 20 mg/dL   Creatinine, Ser 0.87 0.44 - 1.00 mg/dL   Calcium 8.6 (L) 8.9 - 10.3 mg/dL   GFR calc non Af Amer >60 >60 mL/min   GFR calc Af Amer >60 >60 mL/min   Anion gap 7 5 - 15  CBC     Status: Abnormal   Collection Time: 03/19/18 11:58 AM  Result Value Ref Range   WBC 9.6 3.6 - 11.0 K/uL   RBC 4.44 3.80 - 5.20 MIL/uL   Hemoglobin 11.3 (L) 12.0 - 16.0 g/dL   HCT 34.4 (L) 35.0 - 47.0 %   MCV 77.4 (L) 80.0 - 100.0 fL   MCH 25.4 (L) 26.0 - 34.0 pg   MCHC 32.9 32.0 - 36.0 g/dL   RDW 15.9 (H) 11.5 - 14.5 %   Platelets 338 150 - 440 K/uL  Urinalysis, Complete w Microscopic     Status: Abnormal   Collection Time: 03/19/18 11:58 AM  Result Value Ref Range   Color, Urine YELLOW (A) YELLOW   APPearance HAZY (A) CLEAR   Specific Gravity, Urine 1.027 1.005 - 1.030   pH 5.0 5.0 - 8.0   Glucose, UA NEGATIVE NEGATIVE mg/dL   Hgb urine dipstick NEGATIVE NEGATIVE   Bilirubin Urine NEGATIVE NEGATIVE   Ketones, ur 5 (A) NEGATIVE mg/dL   Protein, ur NEGATIVE NEGATIVE mg/dL   Nitrite NEGATIVE NEGATIVE   Leukocytes, UA NEGATIVE NEGATIVE   RBC / HPF 0-5 0 - 5 RBC/hpf   WBC, UA 0-5 0 - 5 WBC/hpf   Bacteria, UA NONE SEEN NONE SEEN   Squamous Epithelial / LPF 0-5 0 - 5   Mucus PRESENT   hCG, quantitative, pregnancy     Status: None   Collection Time: 03/19/18 11:58 AM  Result Value Ref Range   hCG, Beta Chain, Quant, S <1 <5 mIU/mL  Glucose, capillary     Status: None   Collection Time: 03/19/18 12:04 PM  Result Value Ref Range   Glucose-Capillary 78 70 - 99 mg/dL   Comment 1 Notify RN    Comment 2 Document in Chart   POC urine preg, ED     Status: None   Collection Time: 03/19/18  2:45 PM  Result Value Ref Range   Preg  Test, Ur Negative Negative   ____________________________________________  EKG My review and personal interpretation at Time: 11:55   Indication: near syncope  Rate: 65  Rhythm: sinus Axis: normal Other: normal intervals, no stemi, no wpw or brugada ____________________________________________  RADIOLOGY  I personally reviewed all radiographic images ordered to evaluate for the above acute complaints and reviewed radiology reports and findings.  These findings were  personally discussed with the patient.  Please see medical record for radiology report.  ____________________________________________   PROCEDURES  Procedure(s) performed:  Procedures    Critical Care performed: no ____________________________________________   INITIAL IMPRESSION / ASSESSMENT AND PLAN / ED COURSE  Pertinent labs & imaging results that were available during my care of the patient were reviewed by me and considered in my medical decision making (see chart for details).   DDX: Dehydration, vasovagal, orthostasis, seizure, lecture light abnormality, anemia, pregnancy, dysrhythmia  Emily Levy is a 28 y.o. who presents to the ED with symptoms as described above.  She is afebrile [place] dynamically stable.  History does not seem clinically consistent with seizure.  EKG shows no evidence of preexcitation syndrome or dysrhythmia.  No evidence of anemia.  Will check for pregnancy.  Patient endorses decreased p.o. intake and given the positional nature of these episodes with near syncope this seems highly likely to be vasovagal or orthostasis in nature.  Have offered IV fluids.  We will continue to monitor and reassess.  Clinical Course as of Mar 19 1550  Wed Mar 19, 2018  1547 Patient not pregnant.  No evidence of UTI.  Tolerated IV hydration with improvement in symptoms.  At this point I do believe she stable and appropriate for outpatient follow-up.   [PR]    Clinical Course User Index [PR]  Merlyn Lot, MD     As part of my medical decision making, I reviewed the following data within the [place] notes reviewed and incorporated, Labs reviewed, notes from prior ED visits.   ____________________________________________   FINAL CLINICAL IMPRESSION(S) / ED DIAGNOSES  Final diagnoses:  Near syncope      NEW MEDICATIONS STARTED DURING THIS VISIT:  New Prescriptions   No medications on file     Note:  This document was prepared using Dragon voice recognition software and may include unintentional dictation errors.    Merlyn Lot, MD 03/19/18 [phone]

## 2018-07-21 ENCOUNTER — Other Ambulatory Visit: Payer: Self-pay

## 2018-07-21 ENCOUNTER — Emergency Department
Admission: EM | Admit: 2018-07-21 | Discharge: 2018-07-21 | Disposition: A | Discharge status: Home / Self Care | Payer: Self-pay | Attending: Emergency Medicine | Admitting: Emergency Medicine

## 2018-07-21 DIAGNOSIS — J45909 Unspecified asthma, uncomplicated: Secondary | ICD-10-CM | POA: Insufficient documentation

## 2018-07-21 DIAGNOSIS — R109 Unspecified abdominal pain: Secondary | ICD-10-CM | POA: Insufficient documentation

## 2018-07-21 DIAGNOSIS — K529 Noninfective gastroenteritis and colitis, unspecified: Secondary | ICD-10-CM | POA: Insufficient documentation

## 2018-07-21 LAB — URINALYSIS, COMPLETE (UACMP) WITH MICROSCOPIC
Bacteria, UA: NONE SEEN
Bilirubin Urine: NEGATIVE
Glucose, UA: NEGATIVE mg/dL
Ketones, ur: NEGATIVE mg/dL
Leukocytes, UA: NEGATIVE
Nitrite: NEGATIVE
Protein, ur: 30 mg/dL — AB
Specific Gravity, Urine: 1.012 (ref 1.005–1.030)
pH: 5 (ref 5.0–8.0)

## 2018-07-21 LAB — CBC
HCT: 37.3 % (ref 36.0–46.0)
Hemoglobin: 11.6 g/dL — ABNORMAL LOW (ref 12.0–15.0)
MCH: 24.8 pg — ABNORMAL LOW (ref 26.0–34.0)
MCHC: 31.1 g/dL (ref 30.0–36.0)
MCV: 79.9 fL — ABNORMAL LOW (ref 80.0–100.0)
Platelets: 316 10*3/uL (ref 150–400)
RBC: 4.67 MIL/uL (ref 3.87–5.11)
RDW: 16 % — ABNORMAL HIGH (ref 11.5–15.5)
WBC: 12.9 10*3/uL — ABNORMAL HIGH (ref 4.0–10.5)
nRBC: 0 % (ref 0.0–0.2)

## 2018-07-21 LAB — COMPREHENSIVE METABOLIC PANEL
ALT: 8 U/L (ref 0–44)
AST: 16 U/L (ref 15–41)
Albumin: 3.9 g/dL (ref 3.5–5.0)
Alkaline Phosphatase: 43 U/L (ref 38–126)
Anion gap: 8 (ref 5–15)
BUN: 8 mg/dL (ref 6–20)
CO2: 22 mmol/L (ref 22–32)
CREATININE: 1.07 mg/dL — AB (ref 0.44–1.00)
Calcium: 8.6 mg/dL — ABNORMAL LOW (ref 8.9–10.3)
Chloride: 106 mmol/L (ref 98–111)
GFR calc Af Amer: >60 mL/min (ref >60)
GFR calc non Af Amer: >60 mL/min (ref >60)
Glucose, Bld: 82 mg/dL (ref 70–99)
Potassium: 3.6 mmol/L (ref 3.5–5.1)
Sodium: 136 mmol/L (ref 135–145)
TOTAL PROTEIN: 7.4 g/dL (ref 6.5–8.1)
Total Bilirubin: 0.7 mg/dL (ref 0.3–1.2)

## 2018-07-21 LAB — HCG, QUANTITATIVE, PREGNANCY: hCG, Beta Chain, Quant, S: <1 m[IU]/mL (ref <5)

## 2018-07-21 LAB — LIPASE, BLOOD: Lipase: 52 U/L — ABNORMAL HIGH (ref 11–51)

## 2018-07-21 LAB — POCT PREGNANCY, URINE: Preg Test, Ur: NEGATIVE

## 2018-07-21 MED ORDER — ONDANSETRON 4 MG PO TBDP: 4.0000 mg | ORAL_TABLET | Freq: Three times a day (TID) | ORAL | 0 refills | Status: DC | PRN

## 2018-07-21 MED ORDER — ONDANSETRON 4 MG PO TBDP
8.0000 mg | ORAL_TABLET | Freq: Once | ORAL | Status: AC
  Administered 2018-07-21: 8 mg via ORAL
  Filled 2018-07-21: qty 2

## 2018-07-21 NOTE — ED Notes (Signed)
Pt resting quietly at this time, denies any needs, no active vomiting noted.  Pt given PO zofran.

## 2018-07-21 NOTE — ED Notes (Signed)
Pt unable to provide a urine sample at this time.

## 2018-07-21 NOTE — ED Triage Notes (Signed)
Pt c/o generalized abd pain with N/V/D since last Wednesday. States at first she thought it was due to her normal cycle but the other sx started. States she is currently on her last day of her cycle. Denies any difference with that.

## 2018-07-21 NOTE — ED Provider Notes (Signed)
[facility] Emergency Department Provider Note   ____________________________________________    I have reviewed the triage vital signs and the nursing notes.   HISTORY  Chief Complaint Abdominal Pain     HPI Emily Levy is a 27 y.o. female who presents with complaints of nausea vomiting and diarrhea as well as intermittent abdominal cramping.  Symptoms been present for approximately 5 days.  She denies recent travel.  She does not take anything for this.  Eating makes it worse.  Nothing seems to make it better.  Currently she has no abdominal pain.  Does have diarrhea, no blood in bowel or emesis.  Denies fevers.   Past Medical History:  Diagnosis Date  . Abnormal Pap smear of cervix    all previous paps except for one in 2016  . Asthma   . Pelvic pain   . Restless leg     Patient Active Problem List   Diagnosis Date Noted  . Restless leg syndrome 10/02/2016  . Sinus tachycardia 09/12/2015    Past Surgical History:  Procedure Laterality Date  . APPENDECTOMY  2016  . HERNIA REPAIR      Prior to Admission medications   Medication Sig Start Date End Date Taking? Authorizing Provider  amoxicillin (AMOXIL) 500 MG tablet Take 1 tablet (500 mg total) by mouth 3 (three) times daily. Patient not taking: Reported on 01/07/2018 12/12/17   Sherrie George B, FNP  HYDROcodone-acetaminophen (NORCO/VICODIN) 5-325 MG tablet Take 1 tablet by mouth every 4 (four) hours as needed. 01/07/18   Harvest Dark, MD  ondansetron (ZOFRAN ODT) 4 MG disintegrating tablet Take 1 tablet (4 mg total) by mouth every 8 (eight) hours as needed for nausea or vomiting. 07/21/18   Lavonia Drafts, MD     Allergies Sulfa antibiotics  Family History  Problem Relation Age of Onset  . Heart disease Maternal Grandmother     Social History Social History   Tobacco Use  . Smoking status: Never Smoker  . Smokeless tobacco: Never Used  Substance Use Topics  .  Alcohol use: No  . Drug use: No    Review of Systems  Constitutional: No fever/chills Eyes: No visual changes.  ENT: No sore throat. Cardiovascular: Denies chest pain. Respiratory: Denies shortness of breath. Gastrointestinal: As above Genitourinary: Negative for dysuria. Musculoskeletal: Negative for back pain. Skin: Negative for rash. Neurological: Negative for headaches or weakness   ____________________________________________   PHYSICAL EXAM:  VITAL SIGNS: ED Triage Vitals  Enc Vitals Group     BP 07/21/18 1527 119/68     Pulse Rate 07/21/18 1527 65     Resp --      Temp 07/21/18 1527 97.9 F (36.6 C)     Temp Source 07/21/18 1527 Oral     SpO2 07/21/18 1527 98 %     Weight 07/21/18 1527 101.2 kg (223 lb)     Height 07/21/18 1527 1.651 m (5\' 5" )     Head Circumference --      Peak Flow --      Pain Score 07/21/18 1537 8     Pain Loc --      Pain Edu? --      Excl. in [place]? --     Constitutional: Alert and oriented.  Eyes: Conjunctivae are normal.   Nose: No congestion/rhinnorhea. Mouth/Throat: Mucous membranes are moist.    Cardiovascular: Normal rate, regular rhythm. Grossly normal heart sounds.  Good peripheral circulation. Respiratory: Normal respiratory effort.  No retractions. Lungs CTAB. Gastrointestinal: Soft and nontender. No distention.    Musculoskeletal:  Warm and well perfused Neurologic:  Normal speech and language. No gross focal neurologic deficits are appreciated.  Skin:  Skin is warm, dry and intact. No rash noted. Psychiatric: Mood and affect are normal. Speech and behavior are normal.  ____________________________________________   LABS (all labs ordered are listed, but only abnormal results are displayed)  Labs Reviewed  LIPASE, BLOOD - Abnormal; Notable for the following components:      Result Value   Lipase 52 (*)    All other components within normal limits  COMPREHENSIVE METABOLIC PANEL - Abnormal; Notable for the  following components:   Creatinine, Ser 1.07 (*)    Calcium 8.6 (*)    All other components within normal limits  CBC - Abnormal; Notable for the following components:   WBC 12.9 (*)    Hemoglobin 11.6 (*)    MCV 79.9 (*)    MCH 24.8 (*)    RDW 16.0 (*)    All other components within normal limits  URINALYSIS, COMPLETE (UACMP) WITH MICROSCOPIC - Abnormal; Notable for the following components:   Color, Urine YELLOW (*)    APPearance HAZY (*)    Hgb urine dipstick LARGE (*)    Protein, ur 30 (*)    All other components within normal limits  HCG, QUANTITATIVE, PREGNANCY  POC URINE PREG, ED  POCT PREGNANCY, URINE   ____________________________________________  EKG  None ____________________________________________  RADIOLOGY  None ____________________________________________   PROCEDURES  Procedure(s) performed: No  Procedures   Critical Care performed: No ____________________________________________   INITIAL IMPRESSION / ASSESSMENT AND PLAN / ED COURSE  Pertinent labs & imaging results that were available during my care of the patient were reviewed by me and considered in my medical decision making (see chart for details).  Patient overall well-appearing in no acute distress, abdominal exam is reassuring as is the rest of her exam.  Lab work is unremarkable.  Presentation is suspicious for viral gastroenteritis which is common in the community at this time.  Patient treated with p.o. Zofran with significant improvement in nausea.  She was able to tolerate p.o.'s and felt well enough to go home, she knows she can return anytime if any change in her symptoms    ____________________________________________   FINAL CLINICAL IMPRESSION(S) / ED DIAGNOSES  Final diagnoses:  Gastroenteritis        Note:  This document was prepared using Dragon voice recognition software and may include unintentional dictation errors.    Lavonia Drafts, MD 07/21/18  2002

## 2018-07-21 NOTE — ED Notes (Signed)
Lab notified to add on hcg level

## 2018-07-21 NOTE — ED Notes (Signed)
Pt reports started with stomach ached last that is achy in nature and constant and this am she started vomiting. Pt reports has been nauseated all weekend as well. Pt reports last bm was this am and denies any urinary sx's. Pt c/o tenderness with palpation as well. Pt reports her cycle is on now as well and she is cramping.

## 2019-01-26 ENCOUNTER — Encounter: Payer: Self-pay | Admitting: *Deleted

## 2019-01-26 ENCOUNTER — Other Ambulatory Visit: Payer: Self-pay

## 2019-01-26 ENCOUNTER — Emergency Department: Payer: Self-pay

## 2019-01-26 ENCOUNTER — Emergency Department
Admission: EM | Admit: 2019-01-26 | Discharge: 2019-01-26 | Disposition: A | Discharge status: Home / Self Care | Payer: Self-pay | Attending: Student in an Organized Health Care Education/Training Program | Admitting: Student in an Organized Health Care Education/Training Program

## 2019-01-26 DIAGNOSIS — L720 Epidermal cyst: Secondary | ICD-10-CM | POA: Insufficient documentation

## 2019-01-26 DIAGNOSIS — M79662 Pain in left lower leg: Secondary | ICD-10-CM | POA: Insufficient documentation

## 2019-01-26 DIAGNOSIS — L02415 Cutaneous abscess of right lower limb: Secondary | ICD-10-CM | POA: Insufficient documentation

## 2019-01-26 DIAGNOSIS — R58 Hemorrhage, not elsewhere classified: Secondary | ICD-10-CM

## 2019-01-26 DIAGNOSIS — J45909 Unspecified asthma, uncomplicated: Secondary | ICD-10-CM | POA: Insufficient documentation

## 2019-01-26 DIAGNOSIS — L0291 Cutaneous abscess, unspecified: Secondary | ICD-10-CM

## 2019-01-26 DIAGNOSIS — M79604 Pain in right leg: Secondary | ICD-10-CM

## 2019-01-26 LAB — COMPREHENSIVE METABOLIC PANEL
ALT: 8 U/L (ref 0–44)
AST: 13 U/L — ABNORMAL LOW (ref 15–41)
Albumin: 3.6 g/dL (ref 3.5–5.0)
Alkaline Phosphatase: 44 U/L (ref 38–126)
Anion gap: 6 (ref 5–15)
BUN: 8 mg/dL (ref 6–20)
CO2: 25 mmol/L (ref 22–32)
Calcium: 8.4 mg/dL — ABNORMAL LOW (ref 8.9–10.3)
Chloride: 107 mmol/L (ref 98–111)
Creatinine, Ser: 0.78 mg/dL (ref 0.44–1.00)
GFR calc Af Amer: >60 mL/min (ref >60)
GFR calc non Af Amer: >60 mL/min (ref >60)
Glucose, Bld: 87 mg/dL (ref 70–99)
Potassium: 3.5 mmol/L (ref 3.5–5.1)
Sodium: 138 mmol/L (ref 135–145)
Total Bilirubin: 0.6 mg/dL (ref 0.3–1.2)
Total Protein: 6.7 g/dL (ref 6.5–8.1)

## 2019-01-26 LAB — LACTIC ACID, PLASMA: Lactic Acid, Venous: 0.8 mmol/L (ref 0.5–1.9)

## 2019-01-26 LAB — CBC WITH DIFFERENTIAL/PLATELET
Abs Immature Granulocytes: 0.04 10*3/uL (ref 0.00–0.07)
Basophils Absolute: 0.1 10*3/uL (ref 0.0–0.1)
Basophils Relative: 0 %
Eosinophils Absolute: 0.1 10*3/uL (ref 0.0–0.5)
Eosinophils Relative: 1 %
HCT: 33.4 % — ABNORMAL LOW (ref 36.0–46.0)
Hemoglobin: 10.5 g/dL — ABNORMAL LOW (ref 12.0–15.0)
Immature Granulocytes: 0 %
Lymphocytes Relative: 27 %
Lymphs Abs: 3 10*3/uL (ref 0.7–4.0)
MCH: 25.6 pg — ABNORMAL LOW (ref 26.0–34.0)
MCHC: 31.4 g/dL (ref 30.0–36.0)
MCV: 81.5 fL (ref 80.0–100.0)
Monocytes Absolute: 0.8 10*3/uL (ref 0.1–1.0)
Monocytes Relative: 7 %
Neutro Abs: 7.3 10*3/uL (ref 1.7–7.7)
Neutrophils Relative %: 65 %
Platelets: 314 10*3/uL (ref 150–400)
RBC: 4.1 MIL/uL (ref 3.87–5.11)
RDW: 15.9 % — ABNORMAL HIGH (ref 11.5–15.5)
WBC: 11.3 10*3/uL — ABNORMAL HIGH (ref 4.0–10.5)
nRBC: 0 % (ref 0.0–0.2)

## 2019-01-26 LAB — PROTIME-INR
INR: 1 (ref 0.8–1.2)
Prothrombin Time: 13.1 seconds (ref 11.4–15.2)

## 2019-01-26 MED ORDER — MELOXICAM 15 MG PO TABS: 15.0000 mg | ORAL_TABLET | Freq: Every day | ORAL | 0 refills | Status: DC

## 2019-01-26 NOTE — ED Notes (Signed)
See triage note  Presents with possible abscess area to groin  States she was seen by her PCP and placed her on naprosyn and clinda

## 2019-01-26 NOTE — ED Provider Notes (Signed)
[facility] Emergency Department Provider Note  ____________________________________________  Time seen: Approximately 5:50 PM  I have reviewed the triage vital signs and the nursing notes.   HISTORY  Chief Complaint Abscess    HPI Emily Levy is a 28 y.o. female who presents the emergency department complaining of 2 apparently unrelated complaints.  Patient's main complaint is worsening pain to an abscess site.  Patient was being treated with clindamycin for an abscess to the right medial thigh.  Patient had originally been evaluated by primary care.  No indication at that time for incision and drainage.  Patient reports that skin findings are improving but the pain is worsening to the medial thigh.  Patient is also complaining of a non-resolving bruise to the left calf left calf pain.  This is been present x1 month.  Given multiple complaints, patient was referred to emergency department by primary care for further evaluation.  No history of bleeding or clotting disorders.  No history of DVT.  Patient is afebrile.  No other complaints other than what is listed above.  Patient is on clindamycin, having taken 6 days for abscess.  No other medications for these complaints.         Past Medical History:  Diagnosis Date  . Abnormal Pap smear of cervix    all previous paps except for one in 2016  . Asthma   . Pelvic pain   . Restless leg     Patient Active Problem List   Diagnosis Date Noted  . Restless leg syndrome 10/02/2016  . Sinus tachycardia 09/12/2015    Past Surgical History:  Procedure Laterality Date  . APPENDECTOMY  2016  . HERNIA REPAIR      Prior to Admission medications   Medication Sig Start Date End Date Taking? Authorizing Provider  meloxicam (MOBIC) 15 MG tablet Take 1 tablet (15 mg total) by mouth daily. 01/26/19   Cuthriell, Charline Bills, PA-C    Allergies Sulfa antibiotics  Family History  Problem Relation Age of Onset   . Heart disease Maternal Grandmother     Social History Social History   Tobacco Use  . Smoking status: Never Smoker  . Smokeless tobacco: Never Used  Substance Use Topics  . Alcohol use: No  . Drug use: No     Review of Systems  Constitutional: No fever/chills Eyes: No visual changes. No discharge ENT: No upper respiratory complaints. Cardiovascular: no chest pain. Respiratory: no cough. No SOB. Gastrointestinal: No abdominal pain.  No nausea, no vomiting.  No diarrhea.  No constipation. Musculoskeletal: Positive for ecchymosis and pain to the left calf. Skin: Positive for abscess to the right medial thigh.  Skin findings are improving, pain is worsening. Neurological: Negative for headaches, focal weakness or numbness. 10-point ROS otherwise negative.  ____________________________________________   PHYSICAL EXAM:  VITAL SIGNS: ED Triage Vitals  Enc Vitals Group     BP 01/26/19 1640 105/77     Pulse Rate 01/26/19 1640 100     Resp 01/26/19 1640 18     Temp 01/26/19 1640 98.7 F (37.1 C)     Temp Source 01/26/19 1640 Oral     SpO2 01/26/19 1640 99 %     Weight --      Height --      Head Circumference --      Peak Flow --      Pain Score 01/26/19 1641 8     Pain Loc --  Pain Edu? --      Excl. in [place]? --      Constitutional: Alert and oriented. Well appearing and in no acute distress. Eyes: Conjunctivae are normal. PERRL. EOMI. Head: Atraumatic. Neck: No stridor.    Cardiovascular: Normal rate, regular rhythm. Normal S1 and S2.  Good peripheral circulation. Respiratory: Normal respiratory effort without tachypnea or retractions. Lungs CTAB. Good air entry to the bases with no decreased or absent breath sounds. Gastrointestinal: Bowel sounds 4 quadrants. Soft and nontender to palpation. No guarding or rigidity. No palpable masses. No distention. No CVA tenderness. Musculoskeletal: Full range of motion to all extremities. No gross deformities  appreciated.  Visualization of the left calf reveals ecchymosis to the medial calf.  Patient is tender throughout the calf with primary tenderness surrounding and overlying ecchymosis.  Ecchymotic region measures approximately 4 to 6 cm in diameter.  No overlying skin changes to this area.  Patient does have scattered areas of ecchymosis to bilateral lower extremities.  No petechial rash or lesions. Neurologic:  Normal speech and language. No gross focal neurologic deficits are appreciated.  Skin:  Skin is warm, dry and intact. No rash noted.  Visualization of the right thigh reveals what appears to be an improving area of cellulitis/abscess.  Patient is tender to this area of the thigh.  Mild firmness in the center where patient reports original abscess was.  No evidence of incision and drainage which is consistent with patient's story.  No fluctuance or induration at this time.  No purulent drainage. Psychiatric: Mood and affect are normal. Speech and behavior are normal. Patient exhibits appropriate insight and judgement.   ____________________________________________   LABS (all labs ordered are listed, but only abnormal results are displayed)  Labs Reviewed  COMPREHENSIVE METABOLIC PANEL - Abnormal; Notable for the following components:      Result Value   Calcium 8.4 (*)    AST 13 (*)    All other components within normal limits  CBC WITH DIFFERENTIAL/PLATELET - Abnormal; Notable for the following components:   WBC 11.3 (*)    Hemoglobin 10.5 (*)    HCT 33.4 (*)    MCH 25.6 (*)    RDW 15.9 (*)    All other components within normal limits  LACTIC ACID, PLASMA  PROTIME-INR   ____________________________________________  EKG   ____________________________________________  RADIOLOGY I personally viewed and evaluated these images as part of my medical decision making, as well as reviewing the written report by the radiologist.  US Venous Img Lower Unilateral Right  Result  Date: 01/26/2019 CLINICAL DATA:  28 y/o  F; right leg pain.  History of abscess. EXAM: RIGHT LOWER EXTREMITY VENOUS DOPPLER ULTRASOUND TECHNIQUE: Gray-scale sonography with graded compression, as well as color Doppler and duplex ultrasound were performed to evaluate the lower extremity deep venous systems from the level of the common femoral vein and including the common femoral, femoral, profunda femoral, popliteal and calf veins including the posterior tibial, peroneal and gastrocnemius veins when visible. The superficial great saphenous vein was also interrogated. Spectral Doppler was utilized to evaluate flow at rest and with distal augmentation maneuvers in the common femoral, femoral and popliteal veins. COMPARISON:  None. FINDINGS: Contralateral Common Femoral Vein: Respiratory phasicity is normal and symmetric with the symptomatic side. No evidence of thrombus. Normal compressibility. Common Femoral Vein: No evidence of thrombus. Normal compressibility, respiratory phasicity and response to augmentation. Saphenofemoral Junction: No evidence of thrombus. Normal compressibility and flow on color Doppler imaging. Profunda Femoral  Vein: No evidence of thrombus. Normal compressibility and flow on color Doppler imaging. Femoral Vein: No evidence of thrombus. Normal compressibility, respiratory phasicity and response to augmentation. Popliteal Vein: No evidence of thrombus. Normal compressibility, respiratory phasicity and response to augmentation. Calf Veins: No evidence of thrombus. Normal compressibility and flow on color Doppler imaging. Superficial Great Saphenous Vein: No evidence of thrombus. Normal compressibility. Venous Reflux:  None. Other Findings: Hypoechoic focus within the dermis of right inner thigh in the area of concern measuring up to 7 mm, possibly dermal cyst or sequelae of prior abscess. No visible hyperemia. IMPRESSION: No evidence of deep venous thrombosis. Hypoechoic focus within the dermis  of right inner thigh in the area of concern measuring up to 7 mm, possibly dermal cyst or sequelae of prior abscess. No visible hyperemia. Electronically Signed   By: Kristine Garbe M.D.   On: 01/26/2019 19:18    ____________________________________________    PROCEDURES  Procedure(s) performed:    Procedures      Medications - No data to display   ____________________________________________   INITIAL IMPRESSION / ASSESSMENT AND PLAN / ED COURSE  Pertinent labs & imaging results that were available during my care of the patient were reviewed by me and considered in my medical decision making (see chart for details).  Review of the [place] CSRS was performed in accordance of the [place] prior to dispensing any controlled drugs.           Patient's diagnosis is consistent with dermal cyst, ecchymosis of leg.  Patient presented to the emergency department with complaint of ecchymosis to bilateral lower extremities as well as resolving infection to the right thigh.  Patient had either cellulitis or an abscess to the right thigh which she was being treated with clindamycin for.  Patient reports that the skin changes had improved but she felt the lesion and increasing pain to the right medial thigh.  Patient was also concerned given the ecchymosis to bilateral lower extremities.  She was seen by primary care and referred to the emergency department for evaluation with ultrasound.  No evidence of DVT.  Labs are reassuring.  Patient does have a small dermal cyst in the area of previous infection.  Patient is to finish her antibiotics.  I will place her on meloxicam for symptom relief of pain and inflammation in this region.  Follow-up with primary care as needed.  If cyst causes continued problems, patient will follow-up with general surgery. Patient is given ED precautions to return to the ED for any worsening or new  symptoms.     ____________________________________________  FINAL CLINICAL IMPRESSION(S) / ED DIAGNOSES  Final diagnoses:  Abscess  Epidermal cyst  Ecchymosis      NEW MEDICATIONS STARTED DURING THIS VISIT:  ED Discharge Orders         Ordered    meloxicam (MOBIC) 15 MG tablet  Daily     01/26/19 2100              This chart was dictated using voice recognition software/Dragon. Despite best efforts to proofread, errors can occur which can change the meaning. Any change was purely unintentional.    Darletta Moll, PA-C 01/26/19 2101    Merlyn Lot, MD 01/26/19 2138

## 2019-01-26 NOTE — ED Notes (Signed)

## 2019-01-26 NOTE — ED Triage Notes (Signed)
Pt to triage via wheelchair.  Pt has abscess to right inner thigh.  Pt was seen at [facility] today and sent to er for eval and possible u/s  Pt taking abx x 6 days.  No drainage.  Pt has pain.  Pt alert.

## 2019-07-31 HISTORY — PX: ECTOPIC PREGNANCY SURGERY: SHX613

## 2020-02-24 ENCOUNTER — Other Ambulatory Visit: Payer: Self-pay

## 2020-02-24 ENCOUNTER — Encounter: Payer: Self-pay | Admitting: Emergency Medicine

## 2020-02-24 ENCOUNTER — Emergency Department: Payer: Self-pay

## 2020-02-24 ENCOUNTER — Emergency Department
Admission: EM | Admit: 2020-02-24 | Discharge: 2020-02-24 | Disposition: A | Discharge status: Home / Self Care | Payer: Self-pay | Attending: Emergency Medicine | Admitting: Emergency Medicine

## 2020-02-24 DIAGNOSIS — J45909 Unspecified asthma, uncomplicated: Secondary | ICD-10-CM | POA: Insufficient documentation

## 2020-02-24 DIAGNOSIS — O009 Unspecified ectopic pregnancy without intrauterine pregnancy: Secondary | ICD-10-CM | POA: Insufficient documentation

## 2020-02-24 DIAGNOSIS — O00109 Unspecified tubal pregnancy without intrauterine pregnancy: Secondary | ICD-10-CM

## 2020-02-24 DIAGNOSIS — D649 Anemia, unspecified: Secondary | ICD-10-CM

## 2020-02-24 DIAGNOSIS — O209 Hemorrhage in early pregnancy, unspecified: Secondary | ICD-10-CM

## 2020-02-24 LAB — CBC WITH DIFFERENTIAL/PLATELET
Abs Immature Granulocytes: 0.02 10*3/uL (ref 0.00–0.07)
Basophils Absolute: 0 10*3/uL (ref 0.0–0.1)
Basophils Relative: 0 %
Eosinophils Absolute: 0.1 10*3/uL (ref 0.0–0.5)
Eosinophils Relative: 1 %
HCT: 32.8 % — ABNORMAL LOW (ref 36.0–46.0)
Hemoglobin: 10.9 g/dL — ABNORMAL LOW (ref 12.0–15.0)
Immature Granulocytes: 0 %
Lymphocytes Relative: 34 %
Lymphs Abs: 3 10*3/uL (ref 0.7–4.0)
MCH: 28.1 pg (ref 26.0–34.0)
MCHC: 33.2 g/dL (ref 30.0–36.0)
MCV: 84.5 fL (ref 80.0–100.0)
Monocytes Absolute: 0.6 10*3/uL (ref 0.1–1.0)
Monocytes Relative: 7 %
Neutro Abs: 5.2 10*3/uL (ref 1.7–7.7)
Neutrophils Relative %: 58 %
Platelets: 245 10*3/uL (ref 150–400)
RBC: 3.88 MIL/uL (ref 3.87–5.11)
RDW: 14.7 % (ref 11.5–15.5)
WBC: 9 10*3/uL (ref 4.0–10.5)
nRBC: 0 % (ref 0.0–0.2)

## 2020-02-24 LAB — COMPREHENSIVE METABOLIC PANEL
ALT: 10 U/L (ref 0–44)
AST: 15 U/L (ref 15–41)
Albumin: 3.8 g/dL (ref 3.5–5.0)
Alkaline Phosphatase: 34 U/L — ABNORMAL LOW (ref 38–126)
Anion gap: 7 (ref 5–15)
BUN: 10 mg/dL (ref 6–20)
CO2: 26 mmol/L (ref 22–32)
Calcium: 8.4 mg/dL — ABNORMAL LOW (ref 8.9–10.3)
Chloride: 104 mmol/L (ref 98–111)
Creatinine, Ser: 0.75 mg/dL (ref 0.44–1.00)
GFR calc Af Amer: >60 mL/min (ref >60)
GFR calc non Af Amer: >60 mL/min (ref >60)
Glucose, Bld: 91 mg/dL (ref 70–99)
Potassium: 3.6 mmol/L (ref 3.5–5.1)
Sodium: 137 mmol/L (ref 135–145)
Total Bilirubin: 0.7 mg/dL (ref 0.3–1.2)
Total Protein: 6.8 g/dL (ref 6.5–8.1)

## 2020-02-24 LAB — ABO/RH: ABO/RH(D): A POS

## 2020-02-24 LAB — HCG, QUANTITATIVE, PREGNANCY: hCG, Beta Chain, Quant, S: 1472 m[IU]/mL — ABNORMAL HIGH (ref <5)

## 2020-02-24 LAB — POCT PREGNANCY, URINE: Preg Test, Ur: POSITIVE — AB

## 2020-02-24 MED ORDER — LACTATED RINGERS IV BOLUS
1000.0000 mL | Freq: Once | INTRAVENOUS | Status: AC
  Administered 2020-02-24: 1000 mL via INTRAVENOUS

## 2020-02-24 MED ORDER — METHOTREXATE FOR ECTOPIC PREGNANCY
50.0000 mg/m2 | Freq: Once | INTRAMUSCULAR | Status: DC
  Filled 2020-02-24: qty 1

## 2020-02-24 NOTE — Consult Note (Signed)
Reason for Consult:Ectopic Pregnancy  Referring Physician: Dr. Hulan Saas  Emily Levy is an 29 y.o. female.  HPI:  She presents today to the Er with complaints of vaginal bleeding. She reports that she had a small amount of spotting. She recently found out she was pregnant at [facility] and had plans to establish [facility] care with [facility]. She reports mild crampy abdominal pain. She denies syncopal episodes, feeling dizzy or faint. She is otherwise well. She is understandably upset regarding the news of the ectopic pregnancy.    Gynecological History   LMP: 01/11/2020 Describes periods as regular, montlhy, lasting 3-5 days Denies a history of fibroids, polyps, or ovarian cyst Denies a history of PCOS or endometriosis History of STDs: Denies Sexually Active: Yes  Obstetrical History  G1P0 Patient had a positive pregnancy test earlier this month. She has not yet had a [place]. She was planning on establishing care with [facility].    Past Medical History:  Diagnosis Date  . Abnormal Pap smear of cervix    all previous paps except for one in 2016  . Asthma   . Pelvic pain   . Restless leg     Past Surgical History:  Procedure Laterality Date  . APPENDECTOMY  2016  . HERNIA REPAIR      Family History  Problem Relation Age of Onset  . Heart disease Maternal Grandmother     Social History:  reports that she has never smoked. She has never used smokeless tobacco. She reports that she does not drink alcohol and does not use drugs.  Allergies:  Allergies  Allergen Reactions  . Sulfa Antibiotics Hives    Medications: I have reviewed the patient's current medications.  Results for orders placed or performed during the hospital encounter of 02/24/20 (from the past 48 hour(s))  CBC with Differential     Status: Abnormal   Collection Time: 02/24/20  3:38 PM  Result Value Ref Range   WBC 9.0 4.0 - 10.5 K/uL   RBC 3.88 3.87 - 5.11 MIL/uL   Hemoglobin 10.9 (L) 12.0 - 15.0 g/dL    HCT 32.8 (L) 36 - 46 %   MCV 84.5 80.0 - 100.0 fL   MCH 28.1 26.0 - 34.0 pg   MCHC 33.2 30.0 - 36.0 g/dL   RDW 14.7 11.5 - 15.5 %   Platelets 245 150 - 400 K/uL   nRBC 0.0 0.0 - 0.2 %   Neutrophils Relative % 58 %   Neutro Abs 5.2 1.7 - 7.7 K/uL   Lymphocytes Relative 34 %   Lymphs Abs 3.0 0.7 - 4.0 K/uL   Monocytes Relative 7 %   Monocytes Absolute 0.6 0 - 1 K/uL   Eosinophils Relative 1 %   Eosinophils Absolute 0.1 0 - 0 K/uL   Basophils Relative 0 %   Basophils Absolute 0.0 0 - 0 K/uL   Immature Granulocytes 0 %   Abs Immature Granulocytes 0.02 0.00 - 0.07 K/uL    Comment: Performed at [facility], [address] [place], [place] [address]  Comprehensive metabolic panel     Status: Abnormal   Collection Time: 02/24/20  3:38 PM  Result Value Ref Range   Sodium 137 135 - 145 mmol/L   Potassium 3.6 3.5 - 5.1 mmol/L   Chloride 104 98 - 111 mmol/L   CO2 26 22 - 32 mmol/L   Glucose, Bld 91 70 - 99 mg/dL    Comment: Glucose reference range applies only  to samples taken after fasting for at least 8 hours.   BUN 10 6 - 20 mg/dL   Creatinine, Ser 0.75 0.44 - 1.00 mg/dL   Calcium 8.4 (L) 8.9 - 10.3 mg/dL   Total Protein 6.8 6.5 - 8.1 g/dL   Albumin 3.8 3.5 - 5.0 g/dL   AST 15 15 - 41 U/L   ALT 10 0 - 44 U/L   Alkaline Phosphatase 34 (L) 38 - 126 U/L   Total Bilirubin 0.7 0.3 - 1.2 mg/dL   GFR calc non Af Amer >60 >60 mL/min   GFR calc Af Amer >60 >60 mL/min   Anion gap 7 5 - 15    Comment: Performed at [facility], [address], [place], [place] [address]  ABO/Rh     Status: None   Collection Time: 02/24/20  3:38 PM  Result Value Ref Range   ABO/RH(D)      A POS Performed at [facility], [address] [place], [place] [address]   hCG, quantitative, pregnancy     Status: Abnormal   Collection Time: 02/24/20  3:38 PM  Result Value Ref Range   hCG, Beta Chain, Quant, S 1,472 (H) <5 mIU/mL    Comment:          GEST. AGE      CONC.   (mIU/mL)   <=1 WEEK        5 - 50     2 WEEKS       50 - 500     3 WEEKS       100 - 10,000     4 WEEKS     1,000 - 30,000     5 WEEKS     3,500 - 115,000   6-8 WEEKS     12,000 - 270,000    12 WEEKS     15,000 - 220,000        FEMALE AND NON-PREGNANT FEMALE:     LESS THAN 5 mIU/mL Performed at [facility], [address] [place],  [address]   Pregnancy, urine POC     Status: Abnormal   Collection Time: 02/24/20  3:50 PM  Result Value Ref Range   Preg Test, Ur POSITIVE (A) NEGATIVE    Comment:        THE SENSITIVITY OF THIS METHODOLOGY IS >24 mIU/mL     US OB LESS THAN 14 WEEKS WITH OB TRANSVAGINAL  Result Date: 02/24/2020 CLINICAL DATA:  Pregnant patient in first-trimester pregnancy with vaginal bleeding. Beta HCG 1,427. gestational age based on last menstrual period 6 weeks 4 days. EXAM: OBSTETRIC <14 WK [place] AND TRANSVAGINAL OB US TECHNIQUE: Both transabdominal and transvaginal ultrasound examinations were performed for complete evaluation of the gestation as well as the maternal uterus, adnexal regions, and pelvic cul-de-sac. Transvaginal technique was performed to assess early pregnancy. COMPARISON:  None. FINDINGS: Intrauterine gestational sac: No well-defined intrauterine gestational sac. There is small amount of ill-defined fluid in the endometrial canal. Right adnexal ectopic: Yolk sac:  Present Embryo:  Questionable. Cardiac Activity: Not visualized. MSD of ectopic: 4.3 mm   5 w   1 d Maternal uterus/adnexae: There is a small amount of ill-defined fluid in the endometrial canal but no definitive intrauterine gestational sac. The endometrium is thickened. Within the right adnexa adjacent to the right ovary is a 1.2 x 0.8 cm adnexal echogenic lesion containing a gestational sac and yolk sac most consistent with ectopic. Potential fetal pole but  no cardiac activity within the adnexal. This appears adjacent to the right ovary which is normal. The left ovary measures 4.3  x 2.1 x 3.4 cm and contains a probable corpus luteal cyst. There is trace free fluid in the pelvis that appears simple. IMPRESSION: 1. Findings consistent with right adnexal ectopic pregnancy measuring 1.2 x 0.8 cm containing a gestational sac and yolk sac and questionable fetal pole but no cardiac activity. No evidence of rupture. 2. Small amount of ill-defined fluid in the endometrial canal but no intrauterine gestational sac. Critical Value/emergent results were called by telephone at the time of interpretation on 02/24/2020 at 6:05 pm to provder [place], who verbally acknowledged these results. Electronically Signed   By: Keith Rake M.D.   On: 02/24/2020 18:07    Review of Systems  Constitutional: Negative for chills and fever.  HENT: Negative for congestion, hearing loss and sinus pain.   Respiratory: Negative for cough, shortness of breath and wheezing.   Cardiovascular: Negative for chest pain, palpitations and leg swelling.  Gastrointestinal: Negative for abdominal pain, constipation, diarrhea, nausea and vomiting.  Genitourinary: Negative for dysuria, flank pain, frequency, hematuria and urgency.  Musculoskeletal: Negative for back pain.  Skin: Negative for rash.  Neurological: Negative for dizziness and headaches.  Psychiatric/Behavioral: Negative for suicidal ideas. The patient is not nervous/anxious.    Blood pressure (!) 95/58, pulse 77, resp. rate 13, height 5\' 5"  (1.651 m), weight (!) 101.2 kg, last menstrual period 01/09/2020, SpO2 100 %. Physical Exam Vitals and nursing note reviewed.  Constitutional:      Appearance: She is well-developed.  HENT:     Head: Normocephalic and atraumatic.  Cardiovascular:     Rate and Rhythm: Normal rate and regular rhythm.  Pulmonary:     Effort: Pulmonary effort is normal.     Breath sounds: Normal breath sounds.  Abdominal:     General: Bowel sounds are normal.     Palpations: Abdomen is soft.  Musculoskeletal:        General:  Normal range of motion.  Skin:    General: Skin is warm and dry.  Neurological:     Mental Status: She is alert and oriented to person, place, and time.  Psychiatric:        Behavior: Behavior normal.        Thought Content: Thought content normal.        Judgment: Judgment normal.     Assessment/Plan: 29 yo .G1P0 with ectopic pregnancy Discussed option for surgical versus medical management. Discussed pros, cons, and risks associated with both of these treatments. After a careful discussion, Prabhleen elected to proceed with medical management. She will return to the hospital preoperative area tomorrow to receive the methotrexate. She understands that she will need follow up lab work on Sunday and Wednesday of next week. She understands that if there is an appropriate decrease in her lab values between day 4 and day 7 she will then need weekly labs. She understands that compliance with this lab work is essential for follow up and resolution of the pregnancy. She does not have transportation issues that would prevent easily follow up.  Discussed ectopic precautions. Discussed pelvic rest until resolution of the pregnancy. Discussed risk of rupture and cautioned the pregnancy to return urgently to the hospital in the event of abdominal pain, heavy bleeding, dizziness, or syncopal episodes. Encouraged the patient to not be alone until the pregnancy has resolved.   Encouraged smoking cessation  Will have patient follow  up in clinic next week.    Nickayla Mcinnis R Akin Yi 02/24/2020, 7:45 PM

## 2020-02-24 NOTE — ED Provider Notes (Addendum)
[facility] Emergency Department Provider Note  ____________________________________________   First MD Initiated Contact with Patient 02/24/20 1826     (approximate)  I have reviewed the triage vital signs and the nursing notes.   HISTORY  Chief Complaint Vaginal Bleeding   HPI Emily Levy is a 29 y.o. female G1, P0 with a past medical history of asthma and restless leg syndrome pregnant with a last LMP of 6/14 who presents accompanied by her mother for assessment of vaginal spotting that began yesterday.  Patient denies any significant pain, lightheadedness, chest pain, shortness of breath, rash, recent trauma, fevers, diarrhea, dysuria, or other acute complaints.  No prior similar episodes.  No alleviating or aggravating factors.  Patient is she is only taking prenatals and no other medications at this time.  Denies EtOH use, illegal drug use, or tobacco abuse.         Past Medical History:  Diagnosis Date  . Abnormal Pap smear of cervix    all previous paps except for one in 2016  . Asthma   . Pelvic pain   . Restless leg     Patient Active Problem List   Diagnosis Date Noted  . Restless leg syndrome 10/02/2016  . Sinus tachycardia 09/12/2015    Past Surgical History:  Procedure Laterality Date  . APPENDECTOMY  2016  . HERNIA REPAIR      Prior to Admission medications   Not on File    Allergies Sulfa antibiotics  Family History  Problem Relation Age of Onset  . Heart disease Maternal Grandmother     Social History Social History   Tobacco Use  . Smoking status: Never Smoker  . Smokeless tobacco: Never Used  Substance Use Topics  . Alcohol use: No  . Drug use: No    Review of Systems  Review of Systems  Constitutional: Negative for chills and fever.  HENT: Negative for sore throat.   Eyes: Negative for pain.  Respiratory: Negative for cough and stridor.   Cardiovascular: Negative for chest pain.    Gastrointestinal: Negative for vomiting.  Genitourinary:       Vaginal spotting   Skin: Negative for rash.  Neurological: Negative for seizures and loss of consciousness.  Psychiatric/Behavioral: Negative for suicidal ideas.  All other systems reviewed and are negative.   Mild lightheadedness over the last couple days.  ____________________________________________   PHYSICAL EXAM:  VITAL SIGNS: ED Triage Vitals  Enc Vitals Group     BP --      Pulse --      Resp --      Temp --      Temp src --      SpO2 --      Weight 02/24/20 1535 (!) 223 lb 1.7 oz (101.2 kg)     Height 02/24/20 1535 5\' 5"  (1.651 m)     Head Circumference --      Peak Flow --      Pain Score 02/24/20 1534 0     Pain Loc --      Pain Edu? --      Excl. in [place]? --    Vitals:   02/24/20 1825 02/24/20 1930  BP: (!) 95/58 (!) 92/53  Pulse: 77 68  Resp: 13   SpO2: 100% 100%   Physical Exam Vitals and nursing note reviewed.  Constitutional:      General: She is not in acute distress.    Appearance: She is well-developed.  HENT:     Head: Normocephalic and atraumatic.     Right Ear: External ear normal.     Left Ear: External ear normal.     Nose: Nose normal.     Mouth/Throat:     Mouth: Mucous membranes are moist.  Eyes:     Conjunctiva/sclera: Conjunctivae normal.  Cardiovascular:     Rate and Rhythm: Normal rate and regular rhythm.     Heart sounds: No murmur heard.   Pulmonary:     Effort: Pulmonary effort is normal. No respiratory distress.     Breath sounds: Normal breath sounds.  Abdominal:     Palpations: Abdomen is soft.     Tenderness: There is no abdominal tenderness.  Musculoskeletal:        General: No deformity.     Cervical back: Neck supple.  Skin:    General: Skin is warm and dry.  Neurological:     Mental Status: She is alert.  Psychiatric:        Mood and Affect: Affect is tearful.      ____________________________________________   LABS (all labs ordered  are listed, but only abnormal results are displayed)  Labs Reviewed  CBC WITH DIFFERENTIAL/PLATELET - Abnormal; Notable for the following components:      Result Value   Hemoglobin 10.9 (*)    HCT 32.8 (*)    All other components within normal limits  COMPREHENSIVE METABOLIC PANEL - Abnormal; Notable for the following components:   Calcium 8.4 (*)    Alkaline Phosphatase 34 (*)    All other components within normal limits  HCG, QUANTITATIVE, PREGNANCY - Abnormal; Notable for the following components:   hCG, Beta Chain, Quant, S 1,472 (*)    All other components within normal limits  POCT PREGNANCY, URINE - Abnormal; Notable for the following components:   Preg Test, Ur POSITIVE (*)    All other components within normal limits  POC URINE PREG, ED  ABO/RH   ____________________________________________  ____________________________________________  RADIOLOGY  Official radiology report(s): US OB LESS THAN 14 WEEKS WITH OB TRANSVAGINAL  Result Date: 02/24/2020 CLINICAL DATA:  Pregnant patient in first-trimester pregnancy with vaginal bleeding. Beta HCG 1,427. gestational age based on last menstrual period 6 weeks 4 days. EXAM: OBSTETRIC <14 WK [place] AND TRANSVAGINAL OB US TECHNIQUE: Both transabdominal and transvaginal ultrasound examinations were performed for complete evaluation of the gestation as well as the maternal uterus, adnexal regions, and pelvic cul-de-sac. Transvaginal technique was performed to assess early pregnancy. COMPARISON:  None. FINDINGS: Intrauterine gestational sac: No well-defined intrauterine gestational sac. There is small amount of ill-defined fluid in the endometrial canal. Right adnexal ectopic: Yolk sac:  Present Embryo:  Questionable. Cardiac Activity: Not visualized. MSD of ectopic: 4.3 mm   5 w   1 d Maternal uterus/adnexae: There is a small amount of ill-defined fluid in the endometrial canal but no definitive intrauterine gestational sac. The endometrium is  thickened. Within the right adnexa adjacent to the right ovary is a 1.2 x 0.8 cm adnexal echogenic lesion containing a gestational sac and yolk sac most consistent with ectopic. Potential fetal pole but no cardiac activity within the adnexal. This appears adjacent to the right ovary which is normal. The left ovary measures 4.3 x 2.1 x 3.4 cm and contains a probable corpus luteal cyst. There is trace free fluid in the pelvis that appears simple. IMPRESSION: 1. Findings consistent with right adnexal ectopic pregnancy measuring 1.2 x 0.8 cm containing a gestational sac  and yolk sac and questionable fetal pole but no cardiac activity. No evidence of rupture. 2. Small amount of ill-defined fluid in the endometrial canal but no intrauterine gestational sac. Critical Value/emergent results were called by telephone at the time of interpretation on 02/24/2020 at 6:05 pm to provder Cameron, who verbally acknowledged these results. Electronically Signed   By: Keith Rake M.D.   On: 02/24/2020 18:07    ____________________________________________   PROCEDURES  Procedure(s) performed (including Critical Care):  Procedures   ____________________________________________   INITIAL IMPRESSION / ASSESSMENT AND PLAN / ED COURSE        Patient presents with above-stated history exam for assessment of some vaginal spotting in the setting of known pregnancy.  Patient is hemodynamically stable on arrival although blood pressure is borderline low.  I did review her records and notes she has low blood pressures going back as far as 2019 she was noted to have a BP of 99/62 in August 2019 as well as a BP of 105/77 in June of last year.  Patient also notes her systolic is typically only in the high 90s..  As noted on above ultrasound patient has likely ectopic pregnancy in the right adnexa without evidence of rupture or cardiac activity.  No evidence of concurrent intrauterine pregnancy.  Patient's hemoglobin is noted  to be 10 down from a baseline of 11.1-11.6.  I explained to the patient that she was anemic I do not believe she requires a blood transfusion at this time.Marland Kitchen  However she was given some IV fluids as noted below for some mild lightheadedness. Given her Rh is positive,  No indication for RhoGam.  History exam and work-up are not consistent with endometritis or other acute infectious process.  No evidence of hemorrhagic shock at this time.  OB/GYN service was consulted and Dr. Nechama Guard came and evaluated patient in the emergency room.  Per her recommendation we will plan for the patient to receive methotrexate tomorrow afternoon. This was communicated with the patient and that she should return at approximately 1 PM to receive her methotrexate. Plan for the patient to follow-up in OB clinic next week as well as obtain outpatient beta-hCG labs. I updated the patient and her mother on this plan and both are amenable. Patient was discharged stable condition. Strict return precautions advised and discussed.     Medications  lactated ringers bolus 1,000 mL (has no administration in time range)     ____________________________________________   FINAL CLINICAL IMPRESSION(S) / ED DIAGNOSES  Final diagnoses:  Ectopic pregnancy without intrauterine pregnancy, unspecified location  Low hemoglobin     ED Discharge Orders         Ordered    hCG, quantitative, pregnancy     Discontinue     02/24/20 1931           Note:  This document was prepared using Dragon voice recognition software and may include unintentional dictation errors.   Lucrezia Starch, MD 02/24/20 2000    Lucrezia Starch, MD 02/24/20 2029    Lucrezia Starch, MD 02/24/20 2039

## 2020-02-24 NOTE — ED Triage Notes (Signed)
C/O vaginal bleeding x 1 day. States she is currently pregnant.  Will be followed by Princella Ion.  LMP:  01/11/2020.  G1 p0.   Describes bleeding as spotting.   AAOx3.  Skin warm and dry. NAD

## 2020-02-24 NOTE — ED Notes (Signed)
Pelvic Cart at bedside 

## 2020-02-25 ENCOUNTER — Ambulatory Visit
Admission: RE | Admit: 2020-02-25 | Discharge: 2020-02-25 | Disposition: A | Discharge status: Home / Self Care | Payer: Self-pay | Source: Ambulatory Visit | Attending: Obstetrics and Gynecology | Admitting: Obstetrics and Gynecology

## 2020-02-25 ENCOUNTER — Telehealth: Payer: Self-pay | Admitting: Obstetrics and Gynecology

## 2020-02-25 DIAGNOSIS — Z3A00 Weeks of gestation of pregnancy not specified: Secondary | ICD-10-CM | POA: Insufficient documentation

## 2020-02-25 DIAGNOSIS — O009 Unspecified ectopic pregnancy without intrauterine pregnancy: Secondary | ICD-10-CM | POA: Insufficient documentation

## 2020-02-25 MED ORDER — METHOTREXATE FOR ECTOPIC PREGNANCY
50.0000 mg/m2 | Freq: Once | INTRAMUSCULAR | Status: AC
  Administered 2020-02-25: 110 mg via INTRAMUSCULAR
  Filled 2020-02-25: qty 1

## 2020-02-25 NOTE — Telephone Encounter (Signed)
Called and left generic message for patient to call back to confirm scheduled appointment with AMS on Friday, 03/04/20 at 10 am at the [facility] location.

## 2020-02-25 NOTE — Progress Notes (Signed)
Pharmacy-Provider Communication  Pharmacy consulted for chart review for previous chemotherapy treatment. Pharmacy instructed to notify Physician if patient has had previous chemotherapy treatment and the date.  Pharmacist has reviewed patient's chart and did not find any documentation of patient receiving chemotherapy in the past.   Pernell Dupre, PharmD, BCPS Clinical Pharmacist 02/25/2020 9:43 AM

## 2020-02-25 NOTE — Telephone Encounter (Signed)
-----   Message from Homero Fellers, MD sent at 02/24/2020  7:39 PM EDT ----- Please schedule this patient for a GYN visit, ectopic pregnancy follow up with one of the MDs next week. Wednesday or after would be best. Thank you,  - Dr. Gilman Schmidt

## 2020-02-25 NOTE — Discharge Instructions (Signed)
Following Methotrexate Administration for Ectopic Pregnancy  Your physician will obtain follow-up blood work to monitor the effect of the medication.   After receiving methotrexate avoid:  Alcoholic beverages  Vitamins containing folic acid Foods that contain folic acid, including fortified cereal, enriched bread and pasta, peanuts, dark green leafy vegetables, orange juice, and beans  Gas-forming foods  Nonsteroidal antiinflammatory painkillers  Sexual intercourse or any strenuous activity because it may cause the fallopian tube to rupture Do not become pregnant for 3 months to decrease the risk of birth defects.  You may experience side effects, like nausea, vomiting, dizziness, and mouth and lip ulcers.  Most women have abdominal pain a couple of days after the injection.  Notify your obstetric practitioner or return to the emergency department if you develop severe abdominal pain, dizziness or fainting, heavy vaginal bleeding, severe nausea and vomiting, or fever.  Double-flush the toilet with the lid closed for 72 hours after receiving the injection.   

## 2020-02-28 ENCOUNTER — Ambulatory Visit
Admission: RE | Admit: 2020-02-28 | Discharge: 2020-02-28 | Disposition: A | Discharge status: Home / Self Care | Payer: Self-pay | Source: Ambulatory Visit | Attending: Obstetrics and Gynecology | Admitting: Obstetrics and Gynecology

## 2020-02-28 DIAGNOSIS — Z3200 Encounter for pregnancy test, result unknown: Secondary | ICD-10-CM | POA: Insufficient documentation

## 2020-02-28 LAB — HCG, QUANTITATIVE, PREGNANCY: hCG, Beta Chain, Quant, S: 3765 m[IU]/mL — ABNORMAL HIGH (ref <5)

## 2020-02-29 NOTE — Telephone Encounter (Signed)
Patient is aware of location date and time

## 2020-03-02 ENCOUNTER — Ambulatory Visit
Admission: RE | Admit: 2020-03-02 | Discharge: 2020-03-02 | Disposition: A | Discharge status: Home / Self Care | Payer: Self-pay | Source: Ambulatory Visit | Attending: Obstetrics and Gynecology | Admitting: Obstetrics and Gynecology

## 2020-03-02 ENCOUNTER — Other Ambulatory Visit: Payer: Self-pay

## 2020-03-02 VITALS — BP 87/64 | HR 74 | Temp 98.2°F | Resp 16

## 2020-03-02 DIAGNOSIS — O00102 Left tubal pregnancy without intrauterine pregnancy: Secondary | ICD-10-CM | POA: Insufficient documentation

## 2020-03-02 DIAGNOSIS — Z3A00 Weeks of gestation of pregnancy not specified: Secondary | ICD-10-CM | POA: Insufficient documentation

## 2020-03-02 LAB — HCG, QUANTITATIVE, PREGNANCY: hCG, Beta Chain, Quant, S: 3543 m[IU]/mL — ABNORMAL HIGH (ref <5)

## 2020-03-02 MED ORDER — METHOTREXATE FOR ECTOPIC PREGNANCY
50.0000 mg/m2 | Freq: Once | INTRAMUSCULAR | Status: AC
  Administered 2020-03-02: 110 mg via INTRAMUSCULAR
  Filled 2020-03-02: qty 1

## 2020-03-02 NOTE — Progress Notes (Signed)
Pt has been informed by self that a diagnosis of an ectopic pregnancy is strongly suspected.  She understands that there are both medical and surgical therapies for an ectopic pregnancy in this circumstance.  She realizes that a surgical therapy would remove the ectopic pregnancy and possibly the tube.  The other alternative is attempted medical therapy.  As with everything in life, there are risks and benefit to each choice.    Patient is counseled that by choosing medical therapy she may be able to avoid surgery. She is also counseled that the medical therapy may fail with a lack of response or increasing pain.  She may need emergency surgery.  Some of the more common side effects of methotrexate are nausea, irritation of the mouth or eyes and abdominal pain although there are rarer but more serious side effects.  She is counseled that she will be required to be followed very closely with both exams and blood tests and she promises to honor this obligation.  She realizes that there can be no promise of future fertility and that there may be future ectopic pregnancies.  The success of medical treatment of ectopic pregnancy should be 90%.    PROTOCOL: Day 1: Methotrexate 50 mg/sq. meter IM, hCG - Repeat dose as did not have 15% drop Day 4: hCG,  Day 7: hCG.    If < 15% decline in hCG between day 4 and 7--repeat MTX in dose above X1, if >15% decline, follow until < 12MIU.  Give RhoGAM if Rh neg. No ETOH or folic acid, no intercourse till hCG neg, BCP/barrier for 2 months after Rx.  Enter values below and press 'Calculate'.  Height 165  cm Weight 102  kg  Clear Body surface area  2.078  m2 Methotrexate dose  103.9  mg  Methotrexate dose calculated as 50 mg per m2 (rounded to nearest 0.1 mg), based on DuBois formula for body surface area: 5.009381 x W 0.425 x H 0.725  Barnett Applebaum, MD, [facility], [facility] 03/02/2020  3:59 PM

## 2020-03-04 ENCOUNTER — Ambulatory Visit (INDEPENDENT_AMBULATORY_CARE_PROVIDER_SITE_OTHER): Payer: Self-pay | Admitting: Obstetrics and Gynecology

## 2020-03-04 ENCOUNTER — Encounter: Payer: Self-pay | Admitting: Obstetrics and Gynecology

## 2020-03-04 ENCOUNTER — Other Ambulatory Visit: Payer: Self-pay

## 2020-03-04 VITALS — BP 96/54 | HR 74 | Ht 65.0 in | Wt 153.0 lb

## 2020-03-04 DIAGNOSIS — O00109 Unspecified tubal pregnancy without intrauterine pregnancy: Secondary | ICD-10-CM

## 2020-03-04 NOTE — Progress Notes (Signed)
Obstetrics & Gynecology Office Visit   Chief Complaint:  Chief Complaint  Patient presents with   Ectopic Pregnancy    ER follow up 7/28    History of Present Illness: 29 y.o. G1P0010 presenting for medication follow up for a diagnosis of methotrexate for treatment of ectopic pregnancy.  She did not have a 15% decrease in HCG from day 4 to day 7 and received a second dose of methotrexate at 50mg /m2.  Sh has not noted any vaginal bleeding, no abdominal pain since her treatment.  She is scheduled for HCG on Sunday and Wednesday which was changed to Saturday 03/05/2020 and and Tuesday 03/08/2020 given day 1 of MTX dose 2 was 03/02/2020.  Review of Systems: Review of systems negative unless otherwise noted in HPI  Past Medical History:  Past Medical History:  Diagnosis Date   Abnormal Pap smear of cervix    all previous paps except for one in 2016   Asthma    Pelvic pain    Restless leg     Past Surgical History:  Past Surgical History:  Procedure Laterality Date   APPENDECTOMY  2016   HERNIA REPAIR      Gynecologic History: Patient's last menstrual period was 01/09/2020.  Obstetric History: G1P0010  Family History:  Family History  Problem Relation Age of Onset   Heart disease Maternal Grandmother     Social History:  Social History   Socioeconomic History   Marital status: Single    Spouse name: Not on file   Number of children: Not on file   Years of education: Not on file   Highest education level: Not on file  Occupational History   Not on file  Tobacco Use   Smoking status: Never Smoker   Smokeless tobacco: Never Used  Vaping Use   Vaping Use: Every day  Substance and Sexual Activity   Alcohol use: No   Drug use: No   Sexual activity: Yes    Birth control/protection: None  Other Topics Concern   Not on file  Social History Narrative   Not on file   Social Determinants of Health   Financial Resource Strain:    Difficulty  of Paying Living Expenses:   Food Insecurity:    Worried About Charity fundraiser in the Last Year:    Arboriculturist in the Last Year:   Transportation Needs:    Film/video editor (Medical):    Lack of Transportation (Non-Medical):   Physical Activity:    Days of Exercise per Week:    Minutes of Exercise per Session:   Stress:    Feeling of Stress :   Social Connections:    Frequency of Communication with Friends and Family:    Frequency of Social Gatherings with Friends and Family:    Attends Religious Services:    Active Member of Clubs or Organizations:    Attends Archivist Meetings:    Marital Status:   Intimate Partner Violence:    Fear of Current or Ex-Partner:    Emotionally Abused:    Physically Abused:    Sexually Abused:     Allergies:  Allergies  Allergen Reactions   Sulfa Antibiotics Hives    Medications: Prior to Admission medications   Not on File    Physical Exam Vitals:  Vitals:   03/04/20 1017  BP: (!) 96/54  Pulse: 74   Patient's last menstrual period was 01/09/2020.  General: NAD HEENT: normocephalic,  anicteric Pulmonary: No increased work of breathing Neurologic: Grossly intact Psychiatric: mood appropriate, affect full  Assessment: 29 y.o. G1P0010 follow up MTS  Plan: Problem List Items Addressed This Visit    None    Visit Diagnoses    Tubal pregnancy without intrauterine pregnancy, unspecified laterality    -  Primary     1) Ectopic - Retreated second dose of MTX on 03/02/2020 (Day 1), repeat HCG on 8/7 (day 4), and then 03/08/2020 (day 7).  Routine precautions reviewed.  I will contact the patient with 8/7 results  2) A total of 15 minutes were spent in face-to-face contact with the patient during this encounter with over half of that time devoted to counseling and coordination of care.  3) Return in about 5 days (around 03/09/2020) for 03/09/2020 [place].   Malachy Mood, MD,  [facility], [facility] 03/04/2020, 10:41 AM

## 2020-03-05 ENCOUNTER — Ambulatory Visit
Admission: RE | Admit: 2020-03-05 | Discharge: 2020-03-05 | Disposition: A | Discharge status: Home / Self Care | Payer: Self-pay | Source: Ambulatory Visit | Attending: Obstetrics & Gynecology | Admitting: Obstetrics & Gynecology

## 2020-03-05 ENCOUNTER — Other Ambulatory Visit: Payer: Self-pay | Admitting: Obstetrics and Gynecology

## 2020-03-05 ENCOUNTER — Other Ambulatory Visit: Payer: Self-pay

## 2020-03-05 DIAGNOSIS — O00102 Left tubal pregnancy without intrauterine pregnancy: Secondary | ICD-10-CM | POA: Insufficient documentation

## 2020-03-05 DIAGNOSIS — Z3A00 Weeks of gestation of pregnancy not specified: Secondary | ICD-10-CM | POA: Insufficient documentation

## 2020-03-05 LAB — HCG, QUANTITATIVE, PREGNANCY: hCG, Beta Chain, Quant, S: 2846 m[IU]/mL — ABNORMAL HIGH (ref <5)

## 2020-03-05 NOTE — Progress Notes (Signed)
HCG draws update to 8/7 and 8/10 day 4 and day 7

## 2020-03-06 ENCOUNTER — Ambulatory Visit: Admission: RE | Admit: 2020-03-06 | Payer: Self-pay | Source: Ambulatory Visit

## 2020-03-07 ENCOUNTER — Other Ambulatory Visit: Payer: Self-pay

## 2020-03-07 ENCOUNTER — Encounter: Payer: Self-pay | Admitting: Emergency Medicine

## 2020-03-07 ENCOUNTER — Emergency Department: Payer: Self-pay

## 2020-03-07 DIAGNOSIS — R55 Syncope and collapse: Secondary | ICD-10-CM | POA: Insufficient documentation

## 2020-03-07 DIAGNOSIS — R102 Pelvic and perineal pain: Secondary | ICD-10-CM | POA: Insufficient documentation

## 2020-03-07 LAB — HCG, QUANTITATIVE, PREGNANCY: hCG, Beta Chain, Quant, S: 1728 m[IU]/mL — ABNORMAL HIGH (ref <5)

## 2020-03-07 LAB — COMPREHENSIVE METABOLIC PANEL
ALT: 10 U/L (ref 0–44)
AST: 13 U/L — ABNORMAL LOW (ref 15–41)
Albumin: 3.8 g/dL (ref 3.5–5.0)
Alkaline Phosphatase: 33 U/L — ABNORMAL LOW (ref 38–126)
Anion gap: 8 (ref 5–15)
BUN: 11 mg/dL (ref 6–20)
CO2: 25 mmol/L (ref 22–32)
Calcium: 8.6 mg/dL — ABNORMAL LOW (ref 8.9–10.3)
Chloride: 105 mmol/L (ref 98–111)
Creatinine, Ser: 0.72 mg/dL (ref 0.44–1.00)
GFR calc Af Amer: >60 mL/min (ref >60)
GFR calc non Af Amer: >60 mL/min (ref >60)
Glucose, Bld: 83 mg/dL (ref 70–99)
Potassium: 3.7 mmol/L (ref 3.5–5.1)
Sodium: 138 mmol/L (ref 135–145)
Total Bilirubin: 1.1 mg/dL (ref 0.3–1.2)
Total Protein: 7 g/dL (ref 6.5–8.1)

## 2020-03-07 LAB — CBC WITH DIFFERENTIAL/PLATELET
Abs Immature Granulocytes: 0.02 10*3/uL (ref 0.00–0.07)
Basophils Absolute: 0 10*3/uL (ref 0.0–0.1)
Basophils Relative: 0 %
Eosinophils Absolute: 0.1 10*3/uL (ref 0.0–0.5)
Eosinophils Relative: 1 %
HCT: 31.2 % — ABNORMAL LOW (ref 36.0–46.0)
Hemoglobin: 10.2 g/dL — ABNORMAL LOW (ref 12.0–15.0)
Immature Granulocytes: 0 %
Lymphocytes Relative: 40 %
Lymphs Abs: 3.2 10*3/uL (ref 0.7–4.0)
MCH: 27.9 pg (ref 26.0–34.0)
MCHC: 32.7 g/dL (ref 30.0–36.0)
MCV: 85.2 fL (ref 80.0–100.0)
Monocytes Absolute: 0.3 10*3/uL (ref 0.1–1.0)
Monocytes Relative: 3 %
Neutro Abs: 4.5 10*3/uL (ref 1.7–7.7)
Neutrophils Relative %: 56 %
Platelets: 229 10*3/uL (ref 150–400)
RBC: 3.66 MIL/uL — ABNORMAL LOW (ref 3.87–5.11)
RDW: 14.5 % (ref 11.5–15.5)
WBC: 8.1 10*3/uL (ref 4.0–10.5)
nRBC: 0 % (ref 0.0–0.2)

## 2020-03-07 MED ORDER — TRAMADOL HCL 50 MG PO TABS: 50.0000 mg | ORAL_TABLET | Freq: Once | ORAL | Status: DC

## 2020-03-07 NOTE — ED Triage Notes (Signed)
Pt in via POV, reports receiving second dose Methotrexate injection Wednesday due to ectopic pregnancy, since then experiencing right pelvic pain and waking up today very light headed.  Vitals WDL, NAD noted at this time.

## 2020-03-07 NOTE — ED Provider Notes (Signed)
MSE was initiated and I personally evaluated the patient and placed orders (if any) at  6:29 PM on March 07, 2020.  The patient appears stable so that the remainder of the MSE may be completed by another provider.   Victorino Dike, FNP 03/07/20 2113    Harvest Dark, MD 03/07/20 [phone]

## 2020-03-08 ENCOUNTER — Emergency Department
Admission: EM | Admit: 2020-03-08 | Discharge: 2020-03-08 | Disposition: A | Discharge status: Home / Self Care | Payer: Self-pay | Attending: Emergency Medicine | Admitting: Emergency Medicine

## 2020-03-08 ENCOUNTER — Telehealth: Payer: Self-pay

## 2020-03-08 DIAGNOSIS — R102 Pelvic and perineal pain: Secondary | ICD-10-CM

## 2020-03-08 HISTORY — DX: Unspecified ectopic pregnancy without intrauterine pregnancy: O00.90

## 2020-03-08 NOTE — Telephone Encounter (Signed)
Called and spoke with patient's mother to have patient follow up with CRS at her schedule appointment 03/10/20

## 2020-03-08 NOTE — Telephone Encounter (Signed)
Patient mother's is calling to follow up on message left. Please advise

## 2020-03-08 NOTE — Telephone Encounter (Signed)
No based on last night labs can move to weekly labs

## 2020-03-08 NOTE — Telephone Encounter (Signed)
Pt's mom, Sharyn Lull, calling; wants to know if more labs today are needed; had labs done last night and the numbers are going down; they are on MyChart.  [phone]

## 2020-03-10 ENCOUNTER — Ambulatory Visit (INDEPENDENT_AMBULATORY_CARE_PROVIDER_SITE_OTHER): Payer: Self-pay | Admitting: Obstetrics and Gynecology

## 2020-03-10 ENCOUNTER — Inpatient Hospital Stay
Admission: AD | Admit: 2020-03-10 | Discharge: 2020-03-11 | DRG: 819 | Disposition: A | Discharge status: Home / Self Care | Payer: Self-pay | Attending: Obstetrics & Gynecology | Admitting: Obstetrics & Gynecology

## 2020-03-10 ENCOUNTER — Inpatient Hospital Stay: Payer: Self-pay | Admitting: Certified Registered"

## 2020-03-10 ENCOUNTER — Encounter: Payer: Self-pay | Admitting: Obstetrics and Gynecology

## 2020-03-10 ENCOUNTER — Encounter
Admission: AD | Disposition: A | Discharge status: Home / Self Care | Payer: Self-pay | Source: Home / Self Care | Attending: Obstetrics & Gynecology

## 2020-03-10 ENCOUNTER — Inpatient Hospital Stay: Payer: Self-pay

## 2020-03-10 ENCOUNTER — Other Ambulatory Visit: Payer: Self-pay

## 2020-03-10 VITALS — BP 100/70 | HR 84 | Resp 18 | Ht 65.0 in | Wt 147.8 lb

## 2020-03-10 DIAGNOSIS — R1032 Left lower quadrant pain: Secondary | ICD-10-CM

## 2020-03-10 DIAGNOSIS — N83202 Unspecified ovarian cyst, left side: Secondary | ICD-10-CM | POA: Diagnosis present

## 2020-03-10 DIAGNOSIS — O00109 Unspecified tubal pregnancy without intrauterine pregnancy: Secondary | ICD-10-CM

## 2020-03-10 DIAGNOSIS — R102 Pelvic and perineal pain: Secondary | ICD-10-CM | POA: Diagnosis present

## 2020-03-10 DIAGNOSIS — Z20822 Contact with and (suspected) exposure to covid-19: Secondary | ICD-10-CM | POA: Diagnosis present

## 2020-03-10 DIAGNOSIS — O00101 Right tubal pregnancy without intrauterine pregnancy: Principal | ICD-10-CM | POA: Diagnosis present

## 2020-03-10 DIAGNOSIS — N8312 Corpus luteum cyst of left ovary: Secondary | ICD-10-CM

## 2020-03-10 DIAGNOSIS — O00201 Right ovarian pregnancy without intrauterine pregnancy: Secondary | ICD-10-CM

## 2020-03-10 HISTORY — PX: DIAGNOSTIC LAPAROSCOPY WITH REMOVAL OF ECTOPIC PREGNANCY: SHX6449

## 2020-03-10 LAB — TYPE AND SCREEN
ABO/RH(D): A POS
Antibody Screen: NEGATIVE

## 2020-03-10 LAB — CBC
HCT: 29.5 % — ABNORMAL LOW (ref 36.0–46.0)
Hemoglobin: 10.2 g/dL — ABNORMAL LOW (ref 12.0–15.0)
MCH: 28.5 pg (ref 26.0–34.0)
MCHC: 34.6 g/dL (ref 30.0–36.0)
MCV: 82.4 fL (ref 80.0–100.0)
Platelets: 195 10*3/uL (ref 150–400)
RBC: 3.58 MIL/uL — ABNORMAL LOW (ref 3.87–5.11)
RDW: 14.5 % (ref 11.5–15.5)
WBC: 7.8 10*3/uL (ref 4.0–10.5)
nRBC: 0 % (ref 0.0–0.2)

## 2020-03-10 LAB — HCG, QUANTITATIVE, PREGNANCY: hCG, Beta Chain, Quant, S: 793 m[IU]/mL — ABNORMAL HIGH (ref <5)

## 2020-03-10 LAB — SARS CORONAVIRUS 2 BY RT PCR (HOSPITAL ORDER, PERFORMED IN ~~LOC~~ HOSPITAL LAB): SARS Coronavirus 2: NEGATIVE

## 2020-03-10 SURGERY — LAPAROSCOPY, WITH ECTOPIC PREGNANCY SURGICAL TREATMENT
Anesthesia: General

## 2020-03-10 MED ORDER — ROCURONIUM BROMIDE 100 MG/10ML IV SOLN
INTRAVENOUS | Status: DC | PRN
  Administered 2020-03-10: 40 mg via INTRAVENOUS

## 2020-03-10 MED ORDER — LIDOCAINE HCL (PF) 2 % IJ SOLN
INTRAMUSCULAR | Status: AC
  Filled 2020-03-10: qty 5

## 2020-03-10 MED ORDER — DEXAMETHASONE SODIUM PHOSPHATE 10 MG/ML IJ SOLN
INTRAMUSCULAR | Status: AC
  Filled 2020-03-10: qty 1

## 2020-03-10 MED ORDER — SIMETHICONE 80 MG PO CHEW: 80.0000 mg | CHEWABLE_TABLET | Freq: Four times a day (QID) | ORAL | Status: DC | PRN

## 2020-03-10 MED ORDER — SUGAMMADEX SODIUM 200 MG/2ML IV SOLN
INTRAVENOUS | Status: DC | PRN
  Administered 2020-03-10: 200 mg via INTRAVENOUS

## 2020-03-10 MED ORDER — PROMETHAZINE HCL 25 MG/ML IJ SOLN
25.0000 mg | Freq: Once | INTRAMUSCULAR | Status: AC
  Administered 2020-03-10: 25 mg via INTRAVENOUS
  Filled 2020-03-10: qty 1

## 2020-03-10 MED ORDER — KETOROLAC TROMETHAMINE 30 MG/ML IJ SOLN
INTRAMUSCULAR | Status: AC
  Filled 2020-03-10: qty 1

## 2020-03-10 MED ORDER — LIDOCAINE HCL (CARDIAC) PF 100 MG/5ML IV SOSY
PREFILLED_SYRINGE | INTRAVENOUS | Status: DC | PRN
  Administered 2020-03-10: 60 mg via INTRAVENOUS

## 2020-03-10 MED ORDER — ONDANSETRON HCL 4 MG/2ML IJ SOLN
INTRAMUSCULAR | Status: AC
  Filled 2020-03-10: qty 2

## 2020-03-10 MED ORDER — MIDAZOLAM HCL 2 MG/2ML IJ SOLN
INTRAMUSCULAR | Status: DC | PRN
  Administered 2020-03-10: 2 mg via INTRAVENOUS

## 2020-03-10 MED ORDER — LACTATED RINGERS IV SOLN: INTRAVENOUS | Status: DC

## 2020-03-10 MED ORDER — SUCCINYLCHOLINE CHLORIDE 20 MG/ML IJ SOLN
INTRAMUSCULAR | Status: DC | PRN
  Administered 2020-03-10: 100 mg via INTRAVENOUS

## 2020-03-10 MED ORDER — ACETAMINOPHEN 10 MG/ML IV SOLN
INTRAVENOUS | Status: DC | PRN
  Administered 2020-03-10: 1000 mg via INTRAVENOUS

## 2020-03-10 MED ORDER — GLYCOPYRROLATE 0.2 MG/ML IJ SOLN
INTRAMUSCULAR | Status: DC | PRN
  Administered 2020-03-10: .2 mg via INTRAVENOUS

## 2020-03-10 MED ORDER — PROPOFOL 10 MG/ML IV BOLUS
INTRAVENOUS | Status: AC
  Filled 2020-03-10: qty 20

## 2020-03-10 MED ORDER — OXYCODONE HCL 5 MG/5ML PO SOLN: 5.0000 mg | Freq: Once | ORAL | Status: DC | PRN

## 2020-03-10 MED ORDER — MORPHINE SULFATE (PF) 2 MG/ML IV SOLN
2.0000 mg | INTRAVENOUS | Status: DC | PRN
  Administered 2020-03-10: 2 mg via INTRAVENOUS
  Filled 2020-03-10: qty 1

## 2020-03-10 MED ORDER — ONDANSETRON HCL 4 MG/2ML IJ SOLN
INTRAMUSCULAR | Status: DC | PRN
  Administered 2020-03-10: 4 mg via INTRAVENOUS

## 2020-03-10 MED ORDER — FENTANYL CITRATE (PF) 100 MCG/2ML IJ SOLN
INTRAMUSCULAR | Status: DC | PRN
  Administered 2020-03-10 (×2): 50 ug via INTRAVENOUS

## 2020-03-10 MED ORDER — FENTANYL CITRATE (PF) 100 MCG/2ML IJ SOLN
INTRAMUSCULAR | Status: AC
  Filled 2020-03-10: qty 2

## 2020-03-10 MED ORDER — MIDAZOLAM HCL 2 MG/2ML IJ SOLN
INTRAMUSCULAR | Status: AC
  Filled 2020-03-10: qty 2

## 2020-03-10 MED ORDER — BUPIVACAINE HCL (PF) 0.5 % IJ SOLN
INTRAMUSCULAR | Status: DC | PRN
  Administered 2020-03-10: 15 mL

## 2020-03-10 MED ORDER — PROPOFOL 10 MG/ML IV BOLUS
INTRAVENOUS | Status: DC | PRN
  Administered 2020-03-10: 20 mg via INTRAVENOUS
  Administered 2020-03-10: 130 mg via INTRAVENOUS

## 2020-03-10 MED ORDER — DEXAMETHASONE SODIUM PHOSPHATE 10 MG/ML IJ SOLN
INTRAMUSCULAR | Status: DC | PRN
  Administered 2020-03-10: 10 mg via INTRAVENOUS

## 2020-03-10 MED ORDER — ACETAMINOPHEN 325 MG PO TABS: 650.0000 mg | ORAL_TABLET | ORAL | Status: DC | PRN

## 2020-03-10 MED ORDER — ACETAMINOPHEN 10 MG/ML IV SOLN
INTRAVENOUS | Status: AC
  Filled 2020-03-10: qty 100

## 2020-03-10 MED ORDER — MORPHINE SULFATE (PF) 2 MG/ML IV SOLN
1.0000 mg | INTRAVENOUS | Status: DC | PRN
  Administered 2020-03-10 – 2020-03-11 (×4): 2 mg via INTRAVENOUS
  Filled 2020-03-10 (×5): qty 1

## 2020-03-10 MED ORDER — KETOROLAC TROMETHAMINE 30 MG/ML IJ SOLN
30.0000 mg | Freq: Four times a day (QID) | INTRAMUSCULAR | Status: AC
  Administered 2020-03-11 (×4): 30 mg via INTRAVENOUS
  Filled 2020-03-10 (×4): qty 1

## 2020-03-10 MED ORDER — POVIDONE-IODINE 10 % EX SWAB: 2.0000 "application " | Freq: Once | CUTANEOUS | Status: DC

## 2020-03-10 MED ORDER — ONDANSETRON HCL 4 MG/2ML IJ SOLN: 4.0000 mg | Freq: Four times a day (QID) | INTRAMUSCULAR | Status: DC | PRN

## 2020-03-10 MED ORDER — KETOROLAC TROMETHAMINE 30 MG/ML IJ SOLN
INTRAMUSCULAR | Status: DC | PRN
Start: 2020-03-10 — End: 2020-03-10
  Administered 2020-03-10: 30 mg via INTRAVENOUS

## 2020-03-10 MED ORDER — FENTANYL CITRATE (PF) 100 MCG/2ML IJ SOLN
25.0000 ug | INTRAMUSCULAR | Status: DC | PRN
  Administered 2020-03-10 (×3): 25 ug via INTRAVENOUS

## 2020-03-10 MED ORDER — BISACODYL 10 MG RE SUPP: 10.0000 mg | Freq: Every day | RECTAL | Status: DC | PRN

## 2020-03-10 MED ORDER — SUCCINYLCHOLINE CHLORIDE 200 MG/10ML IV SOSY
PREFILLED_SYRINGE | INTRAVENOUS | Status: AC
  Filled 2020-03-10: qty 10

## 2020-03-10 MED ORDER — PHENYLEPHRINE HCL (PRESSORS) 10 MG/ML IV SOLN
INTRAVENOUS | Status: DC | PRN
  Administered 2020-03-10 (×3): 100 ug via INTRAVENOUS

## 2020-03-10 MED ORDER — IBUPROFEN 800 MG PO TABS: 800.0000 mg | ORAL_TABLET | Freq: Four times a day (QID) | ORAL | Status: DC

## 2020-03-10 MED ORDER — ONDANSETRON HCL 4 MG PO TABS: 4.0000 mg | ORAL_TABLET | Freq: Four times a day (QID) | ORAL | Status: DC | PRN

## 2020-03-10 MED ORDER — GLYCOPYRROLATE 0.2 MG/ML IJ SOLN
INTRAMUSCULAR | Status: AC
  Filled 2020-03-10: qty 1

## 2020-03-10 MED ORDER — SENNOSIDES-DOCUSATE SODIUM 8.6-50 MG PO TABS: 1.0000 | ORAL_TABLET | Freq: Every evening | ORAL | Status: DC | PRN

## 2020-03-10 MED ORDER — ROCURONIUM BROMIDE 10 MG/ML (PF) SYRINGE
PREFILLED_SYRINGE | INTRAVENOUS | Status: AC
  Filled 2020-03-10: qty 10

## 2020-03-10 MED ORDER — DOCUSATE SODIUM 100 MG PO CAPS
100.0000 mg | ORAL_CAPSULE | Freq: Two times a day (BID) | ORAL | Status: DC
  Administered 2020-03-11: 100 mg via ORAL
  Filled 2020-03-10: qty 1

## 2020-03-10 MED ORDER — FENTANYL CITRATE (PF) 100 MCG/2ML IJ SOLN
INTRAMUSCULAR | Status: AC
  Administered 2020-03-10: 25 ug via INTRAVENOUS
  Filled 2020-03-10: qty 2

## 2020-03-10 MED ORDER — OXYCODONE-ACETAMINOPHEN 5-325 MG PO TABS
1.0000 | ORAL_TABLET | ORAL | Status: DC | PRN
  Administered 2020-03-11 (×4): 2 via ORAL
  Filled 2020-03-10 (×4): qty 2

## 2020-03-10 MED ORDER — OXYCODONE HCL 5 MG PO TABS: 5.0000 mg | ORAL_TABLET | Freq: Once | ORAL | Status: DC | PRN

## 2020-03-10 SURGICAL SUPPLY — 41 items
ADH SKN CLS APL DERMABOND .7 (GAUZE/BANDAGES/DRESSINGS) ×1
APL PRP STRL LF DISP 70% ISPRP (MISCELLANEOUS) ×1
BAG SPEC RTRVL LRG 6X4 10 (ENDOMECHANICALS)
BLADE SURG SZ11 CARB STEEL (BLADE) ×2 IMPLANT
CANISTER SUCT 1200ML W/VALVE (MISCELLANEOUS) ×2 IMPLANT
CATH ROBINSON RED A/P 16FR (CATHETERS) ×2 IMPLANT
CHLORAPREP W/TINT 26 (MISCELLANEOUS) ×2 IMPLANT
COVER LIGHT HANDLE STERIS (MISCELLANEOUS) ×1 IMPLANT
COVER WAND RF STERILE (DRAPES) ×1 IMPLANT
DERMABOND ADVANCED (GAUZE/BANDAGES/DRESSINGS) ×1
DERMABOND ADVANCED .7 DNX12 (GAUZE/BANDAGES/DRESSINGS) ×1 IMPLANT
DRSG TELFA 4X3 1S NADH ST (GAUZE/BANDAGES/DRESSINGS) IMPLANT
GAUZE 4X4 16PLY RFD (DISPOSABLE) ×2 IMPLANT
GLOVE BIO SURGEON STRL SZ8 (GLOVE) ×2 IMPLANT
GLOVE INDICATOR 8.0 STRL GRN (GLOVE) ×2 IMPLANT
GOWN STRL REUS W/ TWL LRG LVL3 (GOWN DISPOSABLE) ×1 IMPLANT
GOWN STRL REUS W/ TWL XL LVL3 (GOWN DISPOSABLE) ×1 IMPLANT
GOWN STRL REUS W/TWL LRG LVL3 (GOWN DISPOSABLE) ×2
GOWN STRL REUS W/TWL XL LVL3 (GOWN DISPOSABLE) ×2
IRRIGATION STRYKERFLOW (MISCELLANEOUS) IMPLANT
IRRIGATOR STRYKERFLOW (MISCELLANEOUS) ×2
IV LACTATED RINGERS 1000ML (IV SOLUTION) IMPLANT
KIT PINK PAD W/HEAD ARE REST (MISCELLANEOUS) ×2
KIT PINK PAD W/HEAD ARM REST (MISCELLANEOUS) ×1 IMPLANT
LABEL OR SOLS (LABEL) ×2 IMPLANT
NEEDLE VERESS 14GA 120MM (NEEDLE) ×2 IMPLANT
NS IRRIG 500ML POUR BTL (IV SOLUTION) ×2 IMPLANT
PACK GYN LAPAROSCOPIC (MISCELLANEOUS) ×2 IMPLANT
PAD PREP 24X41 OB/GYN DISP (PERSONAL CARE ITEMS) ×2 IMPLANT
POUCH SPECIMEN RETRIEVAL 10MM (ENDOMECHANICALS) IMPLANT
SCISSORS METZENBAUM CVD 33 (INSTRUMENTS) ×1 IMPLANT
SET TUBE SMOKE EVAC HIGH FLOW (TUBING) ×2 IMPLANT
SHEARS HARMONIC ACE PLUS 36CM (ENDOMECHANICALS) ×1 IMPLANT
SLEEVE ENDOPATH XCEL 5M (ENDOMECHANICALS) ×1 IMPLANT
SPONGE GAUZE 2X2 8PLY STRL LF (GAUZE/BANDAGES/DRESSINGS) IMPLANT
STRAP SAFETY 5IN WIDE (MISCELLANEOUS) ×2 IMPLANT
SUT VIC AB 2-0 UR6 27 (SUTURE) IMPLANT
SUT VIC AB 4-0 PS2 18 (SUTURE) IMPLANT
SYR 10ML LL (SYRINGE) ×2 IMPLANT
TROCAR ENDO BLADELESS 11MM (ENDOMECHANICALS) ×1 IMPLANT
TROCAR XCEL NON-BLD 5MMX100MML (ENDOMECHANICALS) ×2 IMPLANT

## 2020-03-10 NOTE — Anesthesia Procedure Notes (Signed)
Procedure Name: Intubation Performed by: Rolla Plate, CRNA Pre-anesthesia Checklist: Patient identified, Patient being monitored, Timeout performed, Emergency Drugs available and Suction available Patient Re-evaluated:Patient Re-evaluated prior to induction Oxygen Delivery Method: Circle system utilized Preoxygenation: Pre-oxygenation with 100% oxygen Induction Type: IV induction and Rapid sequence Laryngoscope Size: 3 and McGraph Grade View: Grade I Tube type: Oral Tube size: 7.0 mm Number of attempts: 1 Airway Equipment and Method: Stylet and Video-laryngoscopy Placement Confirmation: ETT inserted through vocal cords under direct vision,  positive ETCO2 and breath sounds checked- equal and bilateral Secured at: 21 cm Tube secured with: Tape Dental Injury: Teeth and Oropharynx as per pre-operative assessment  Comments: OP clear with DL, tongue ring intact, pt states unable to remove

## 2020-03-10 NOTE — Transfer of Care (Signed)
Immediate Anesthesia Transfer of Care Note  Patient: Emily Levy  Procedure(s) Performed: DIAGNOSTIC LAPAROSCOPY WITH REMOVAL OF ECTOPIC PREGNANCY Right Salpingostomy Left Ovarian Cystectomy (N/A )  Patient Location: PACU  Anesthesia Type:General  Level of Consciousness: awake  Airway & Oxygen Therapy: Patient Spontanous Breathing and Patient connected to face mask oxygen  Post-op Assessment: Report given to RN and Post -op Vital signs reviewed and stable  Post vital signs: Reviewed  Last Vitals:  Vitals Value Taken Time  BP    Temp    Pulse    Resp    SpO2      Last Pain:  Vitals:   03/10/20 1613  TempSrc: Oral  PainSc:       Patients Stated Pain Goal: 0 (36/46/80 3212)  Complications: No complications documented.

## 2020-03-10 NOTE — Progress Notes (Signed)
Emily Levy is an 29 y.o. female.   Chief Complaint: Ectopic pregnancy, right side  HPI:  Patient presents today to the office with complaints of severe abdominal pain.  Patient is being treated for a right-sided ectopic pregnancy with methotrexate.  She has had 2 doses of methotrexate.  Her beta hCG has decreased appropriately after her second dose.  However she reports that on Tuesday evening her abdominal pain acutely worsened.  She reports some dizziness.  She denies any fainting.  The pain is severe and she is doubled over in pain with difficulty walking.   Past Medical History:  Diagnosis Date  . Abnormal Pap smear of cervix    all previous paps except for one in 2016  . Asthma   . Ectopic pregnancy   . Pelvic pain   . Restless leg     Past Surgical History:  Procedure Laterality Date  . APPENDECTOMY  2016  . HERNIA REPAIR      Family History  Problem Relation Age of Onset  . Heart disease Maternal Grandmother    Social History:  reports that she has never smoked. She has never used smokeless tobacco. She reports that she does not drink alcohol and does not use drugs.  Allergies:  Allergies  Allergen Reactions  . Sulfa Antibiotics Hives    (Not in a hospital admission)   No results found for this or any previous visit (from the past 48 hour(s)). No results found.  Review of Systems  Constitutional: Negative for chills and fever.  HENT: Negative for congestion, hearing loss and sinus pain.   Respiratory: Negative for cough, shortness of breath and wheezing.   Cardiovascular: Negative for chest pain, palpitations and leg swelling.  Gastrointestinal: Negative for abdominal pain, constipation, diarrhea, nausea and vomiting.  Genitourinary: Negative for dysuria, flank pain, frequency, hematuria and urgency.  Musculoskeletal: Negative for back pain.  Skin: Negative for rash.  Neurological: Negative for dizziness and headaches.  Psychiatric/Behavioral: Negative  for suicidal ideas. The patient is not nervous/anxious.     Blood pressure 100/70, pulse 84, resp. rate 18, height 5\' 5"  (1.651 m), weight 147 lb 12.8 oz (67 kg), last menstrual period 03/10/2020, SpO2 97 %, Emily if currently breastfeeding.   Physical Exam Vitals and nursing note reviewed.  Constitutional:      Appearance: She is well-developed.  HENT:     Head: Normocephalic and atraumatic.  Cardiovascular:     Rate and Rhythm: Normal rate and regular rhythm.  Pulmonary:     Effort: Pulmonary effort is normal.     Breath sounds: Normal breath sounds.  Abdominal:     General: Abdomen is flat. Bowel sounds are normal. There is no distension.     Palpations: Abdomen is soft.     Tenderness: There is abdominal tenderness. There is rebound. There is no guarding.     Comments: Tender in left lower quadrant  Musculoskeletal:        General: Normal range of motion.  Skin:    General: Skin is warm and dry.  Neurological:     Mental Status: She is alert and oriented to person, place, and time.  Psychiatric:        Behavior: Behavior normal.        Thought Content: Thought content normal.        Judgment: Judgment normal.     Bedside pelvic ultrasound performed by myself here in the office does not show fluid in the cul-de-sac.  Small amount  of fluid surrounding the left ovary and left corpus luteum cyst. Abdominal ultrasound does not show significant of fluid or blood in the abdominal cavity.  Assessment/Plan 29 year old with right-sided ectopic pregnancy.  Left-sided corpus luteum cyst noted on ultrasound from Monday. ER has long extended weights because of the Covid pandemic ongoing.  For this reason we will direct admit patient to the floor for stat labs and ultrasound.  Have discussed this case with Dr. Kenton Kingfisher who will be taking care of the patient in the hospital.  Patient was direct admitted and orders were placed. Discussed with the patient possibility of observation versus  laparoscopy.   More than 45 minutes were spent face to face with the patient in the room, reviewing the medical record, labs and images, and coordinating care for the patient. The plan of management was discussed in detail and counseling was provided.    Homero Fellers, MD 03/10/2020, 3:03 PM

## 2020-03-10 NOTE — Progress Notes (Signed)
Pt. directly admitted to mother/baby unit for severe abdominal pain r/t right-sided ectopic pregnancy. She reports severe left-sided contant abdominal pain, rated 10/10, that worsened last night. She denies LOF, but reports bright red vaginal bleeding with some clots. Fresh pad put on upon admission. Pt. Reports no diarrhea or urinary symptoms, but persistent nausea and dizziness. Pt alert and oriented, VSS. MD aware of pt. admission to unit and orders placed.

## 2020-03-10 NOTE — H&P (Signed)
Obstetrics & Gynecology History and Physical Note  History of Present Illness: Emily Levy is a 29 y.o. G1P0010 (Patient's last menstrual period was 03/10/2020.), with CC of pain.  Pt has severe LLQ pain that began yesterday and has continued.  She has been followed for a right ectopic pregnancy (and left CL ovarian cyst) with two doses of MTX and eventual decline in beta HCG levels (still elevated as of last check).  She did not initially have pain related to her ectopic, just bleeding.  She developed some pain on Monday, related to her ectopic resolving perhaps as her numbers were coming down.  Pain has worsened and shifter to her left. Some vag bleeding.  Dizziness.  She cannot walk very well and is doubled over in pain in bed.  [place] was uncomfortable to her as well.  Beta hCG today 793, last check was 03/07/20 and was 1728. [place] today shows right ectopic unchanged in size, moderate amount of FF in Pelvis, and left sided 2 cm CL cyst also unchanged in size from last [place].  [place] 03/10/20:  Right ectopic pregnancy with a mean sac diameter of 7.4 mm, similar size to prior study. Complex cystic area in the left ovary measuring up to 2.6 cm, also stable since prior study. This likely reflects hemorrhagic cyst or corpus luteum.  [place] 03/07/20: 1. Findings compatible with fetal demise associated with the previously demonstrated right adnexal ectopic pregnancy (5mm). 2. 2.7 cm complicated cystic area in the left ovary with fewer internal echoes possibly representing an evolving corpus luteum or other cystic lesion.   ROS: A review of systems was performed and was complete and comprehensive, except as stated in the above HPI.  OBGYN History: As per HPI. OB History    Gravida  1   Para  0   Term  0   Preterm  0   AB  1   Living  0     SAB  0   TAB  0   Ectopic  1   Multiple  0   Live Births               Past Medical History: Past Medical History:  Diagnosis Date  . Abnormal Pap smear  of cervix    all previous paps except for one in 2016  . Asthma   . Ectopic pregnancy   . Pelvic pain   . Restless leg     Past Surgical History: Past Surgical History:  Procedure Laterality Date  . APPENDECTOMY  2016  . HERNIA REPAIR      Family History:  Family History  Problem Relation Age of Onset  . Heart disease Maternal Grandmother    She  denies any female cancers, bleeding or blood clotting disorders.   Social History:  Social History   Socioeconomic History  . Marital status: Single    Spouse name: Not on file  . Number of children: Not on file  . Years of education: Not on file  . Highest education level: Not on file  Occupational History  . Not on file  Tobacco Use  . Smoking status: Never Smoker  . Smokeless tobacco: Never Used  Vaping Use  . Vaping Use: Former  Substance and Sexual Activity  . Alcohol use: No  . Drug use: No  . Sexual activity: Yes    Birth control/protection: None  Other Topics Concern  . Not on file  Social History Narrative  . Not on file  Social Determinants of Health   Financial Resource Strain:   . Difficulty of Paying Living Expenses:   Food Insecurity:   . Worried About Charity fundraiser in the Last Year:   . Arboriculturist in the Last Year:   Transportation Needs:   . Film/video editor (Medical):   Marland Kitchen Lack of Transportation (Non-Medical):   Physical Activity:   . Days of Exercise per Week:   . Minutes of Exercise per Session:   Stress:   . Feeling of Stress :   Social Connections:   . Frequency of Communication with Friends and Family:   . Frequency of Social Gatherings with Friends and Family:   . Attends Religious Services:   . Active Member of Clubs or Organizations:   . Attends Archivist Meetings:   Marland Kitchen Marital Status:   Intimate Partner Violence:   . Fear of Current or Ex-Partner:   . Emotionally Abused:   Marland Kitchen Physically Abused:   . Sexually Abused:     Allergy: Allergies   Allergen Reactions  . Sulfa Antibiotics Hives    Current Outpatient Medications: No medications prior to admission.    Hospital Medications: Current Facility-Administered Medications  Medication Dose Route Frequency Provider Last Rate Last Admin  . lactated ringers infusion   Intravenous Continuous Schuman, Christanna R, MD 100 mL/hr at 03/10/20 1600 New Bag at 03/10/20 1600  . morphine 2 MG/ML injection 2 mg  2 mg Intravenous Q2H PRN Schuman, Christanna R, MD   2 mg at 03/10/20 1609    Physical Exam: Vitals:   03/10/20 1613 03/10/20 1634  BP: (!) 102/57   Pulse: 82   Resp: 18   Temp: 98 F (36.7 C)   TempSrc: Oral   SpO2: 92%   Weight:  68.9 kg  Height:  5\' 5"  (1.651 m)    Temp:  [98 F (36.7 C)] 98 F (36.7 C) (08/12 1613) Pulse Rate:  [82-84] 82 (08/12 1613) Resp:  [18] 18 (08/12 1613) BP: (100-102)/(57-70) 102/57 (08/12 1613) SpO2:  [92 %-97 %] 92 % (08/12 1613) Weight:  [67 kg-68.9 kg] 68.9 kg (08/12 1634) No intake/output data recorded. Total I/O In: -  Out: 100 [Urine:100]  Intake/Output Summary (Last 24 hours) at 03/10/2020 1800 Last data filed at 03/10/2020 1629 Gross per 24 hour  Intake --  Output 100 ml  Net -100 ml    Body mass index is 25.29 kg/m. Constitutional: Well nourished, well developed female in no acute distress.  HEENT: normal Neck:  Supple, normal appearance, and no thyromegaly  Cardiovascular:Regular rate and rhythm.  No murmurs, rubs or gallops. Respiratory:  Clear to auscultation bilateral. Normal respiratory effort Abdomen: positive bowel sounds and no masses, hernias; diffusely tender to palpation L>R, non distended, POS Rebound Neuro: grossly intact Psych:  Normal mood and affect.  Skin:  Warm and dry.  MS: normal gait and normal bilateral lower extremity strength/ROM/symmetry Lymphatic:  No inguinal lymphadenopathy.   Recent Labs  Lab 03/07/20 1824 03/10/20 1543  WBC 8.1 7.8  HGB 10.2* 10.2*  HCT 31.2* 29.5*  PLT  229 195   Recent Labs  Lab 03/07/20 1824  NA 138  K 3.7  CL 105  CO2 25  BUN 11  CREATININE 0.72  CALCIUM 8.6*  PROT 7.0  BILITOT 1.1  ALKPHOS 33*  ALT 10  AST 13*  GLUCOSE 83   Recent Labs  Lab 03/10/20 1543  ABORH A POS   Assessment: Ms. Schnepp is  a 29 y.o. G1P0010 (Patient's last menstrual period was 03/10/2020.) who presented to the ED with complaints of PAIN; findings are consistent with ECTOPIC and OVARIAN CYST.  Pain is severe to require recommendations for surgery.  Pt desires preservation of tubes as she desires future pregnancy.  Plan is salpingostomy and cystectomy.  There is a chance for salpingectomy based on amount of damage if the tube is ruptured, and I reassured patient I will only remove if absolutely necessary for life saving function now.  She understands that leaving a repaired ruptured tube still may increase risk for future ectopic pregnancy.  Plan: Laparoscopy, left ovarian cystectomy, right salpingostomy Pros and cons discussed, consent obtained (prior to administration of morphine for pain). The risks of surgery discussed with the patient included but were not limited to: bleeding which may require transfusion or reoperation; infection which may require antibiotics; injury to bowel, bladder, ureters or other surrounding organs; need for additional procedures including hysterectomy in the event of a life-threatening hemorrhage;  incisional problems, thromboembolic phenomenon and other postoperative/anesthesia complications. The patient concurred with the proposed plan, giving informed written consent for the procedure.   Emily Applebaum, MD, [facility], [facility] 03/10/2020  6:00 PM Pager [phone]

## 2020-03-10 NOTE — Discharge Instructions (Signed)
Laparoscopy, Care After This sheet gives you information about how to care for yourself after your procedure. Your health care provider may also give you more specific instructions. If you have problems or questions, contact your health care provider. What can I expect after the procedure? After the procedure, it is common to have:  Mild discomfort in the abdomen.  Sore throat. Women who have laparoscopy with pelvic examination may have mild cramping and fluid coming from the vagina for a few days after the procedure. Follow these instructions at home: Medicines  Take over-the-counter and prescription medicines only as told by your health care provider.  If you were prescribed an antibiotic medicine, take it as told by your health care provider. Do not stop taking the antibiotic even if you start to feel better. Driving  Do not drive for 24 hours if you were given a medicine to help you relax (sedative) during your procedure.  Do not drive or use heavy machinery while taking prescription pain medicine. Bathing  Do not take baths, swim, or use a hot tub until your health care provider approves. You may take showers. Incision care   Follow instructions from your health care provider about how to take care of your incisions. Make sure you: ? Wash your hands with soap and water before you change your bandage (dressing). If soap and water are not available, use hand sanitizer. ? Change your dressing as told by your health care provider. ? Leave stitches (sutures), skin glue, or adhesive strips in place. These skin closures may need to stay in place for 2 weeks or longer. If adhesive strip edges start to loosen and curl up, you may trim the loose edges. Do not remove adhesive strips completely unless your health care provider tells you to do that.  Check your incision areas every day for signs of infection. Check for: ? Redness, swelling, or pain. ? Fluid or blood. ? Warmth. ? Pus or a  bad smell. Activity  Return to your normal activities as told by your health care provider. Ask your health care provider what activities are safe for you.  Do not lift anything that is heavier than 10 lb (4.5 kg), or the limit that you are told, until your health care provider says that it is safe. General instructions  To prevent or treat constipation while you are taking prescription pain medicine, your health care provider may recommend that you: ? Drink enough fluid to keep your urine pale yellow. ? Take over-the-counter or prescription medicines. ? Eat foods that are high in fiber, such as fresh fruits and vegetables, whole grains, and beans. ? Limit foods that are high in fat and processed sugars, such as fried and sweet foods.  Do not use any products that contain nicotine or tobacco, such as cigarettes and e-cigarettes. If you need help quitting, ask your health care provider.  Keep all follow-up visits as told by your health care provider. This is important. Contact a health care provider if:  You develop shoulder pain.  You feel lightheaded or faint.  You are unable to pass gas or have a bowel movement.  You feel nauseous or you vomit.  You develop a rash.  You have redness, swelling, or pain around any incision.  You have fluid or blood coming from any incision.  Any incision feels warm to the touch.  You have pus or a bad smell coming from any incision.  You have a fever or chills. Get help right  away if:  You have severe pain.  You have vomiting that does not go away.  You have heavy bleeding from the vagina.  Any incision opens.  You have trouble breathing.  You have chest pain. Summary  After the procedure, it is common to have mild discomfort in the abdomen and a sore throat.  Check your incision areas every day for signs of infection.  Return to your normal activities as told by your health care provider. Ask your health care provider what  activities are safe for you. This information is not intended to replace advice given to you by your health care provider. Make sure you discuss any questions you have with your health care provider. Document Revised: 06/28/2017 Document Reviewed: 01/09/2017 Elsevier Patient Education  [place].

## 2020-03-10 NOTE — Anesthesia Preprocedure Evaluation (Signed)
Anesthesia Evaluation  Patient identified by MRN, date of birth, ID band Patient awake    Reviewed: Allergy & Precautions, H&P , NPO status , Patient's Chart, lab work & pertinent test results  History of Anesthesia Complications Negative for: history of anesthetic complications  Airway Mallampati: II  TM Distance: >3 FB     Dental  (+) Teeth Intact Tongue ring, pt unable to remove:   Pulmonary asthma , neg sleep apnea, neg COPD,    breath sounds clear to auscultation       Cardiovascular (-) angina(-) Past MI and (-) Cardiac Stents negative cardio ROS  (-) dysrhythmias  Rhythm:regular Rate:Normal     Neuro/Psych negative neurological ROS  negative psych ROS   GI/Hepatic negative GI ROS, Neg liver ROS,   Endo/Other  negative endocrine ROS  Renal/GU      Musculoskeletal   Abdominal   Peds  Hematology negative hematology ROS (+)   Anesthesia Other Findings Past Medical History: No date: Abnormal Pap smear of cervix     Comment:  all previous paps except for one in 2016 No date: Asthma No date: Ectopic pregnancy No date: Pelvic pain No date: Restless leg  Past Surgical History: 2016: APPENDECTOMY No date: HERNIA REPAIR  BMI    Body Mass Index: 25.29 kg/m      Reproductive/Obstetrics negative OB ROS                             Anesthesia Physical Anesthesia Plan  ASA: II  Anesthesia Plan: General ETT and Rapid Sequence   Post-op Pain Management:    Induction:   PONV Risk Score and Plan: Ondansetron, Dexamethasone, Midazolam and Treatment may vary due to age or medical condition  Airway Management Planned:   Additional Equipment:   Intra-op Plan:   Post-operative Plan:   Informed Consent: I have reviewed the patients History and Physical, chart, labs and discussed the procedure including the risks, benefits and alternatives for the proposed anesthesia with the  patient or authorized representative who has indicated his/her understanding and acceptance.     Dental Advisory Given  Plan Discussed with: Anesthesiologist, CRNA and Surgeon  Anesthesia Plan Comments:         Anesthesia Quick Evaluation

## 2020-03-10 NOTE — Op Note (Signed)
Emily Levy PROCEDURE DATE: 03/10/2020  PREOPERATIVE DIAGNOSIS: Right ectopic pregnancy, Left ovarian cyst, pelvic pain POSTOPERATIVE DIAGNOSIS:  same PROCEDURE: Laparoscopic right salpingostomy and removal of ectopic pregnancy, left ovarian cystectomy SURGEON:  Dr. Barnett Applebaum ANESTHESIOLOGIST: Tera Mater, MD  ANES: General  INDICATIONS: 29 y.o. G1P0010 here with the preoperative diagnoses as listed above.  Please refer to preoperative notes for more details. Patient was counseled regarding need for laparoscopic salpingectomy. Risks of surgery including bleeding which may require transfusion or reoperation, infection, injury to bowel or other surrounding organs, need for additional procedures including laparotomy and other postoperative/anesthesia complications were explained to patient.  Written informed consent was obtained.  FINDINGS:  No hemoperitoneum.  Dilated right fallopian tube containing ectopic gestation. Small normal appearing uterus, normal left fallopian tube, right ovary.  Left ovary with cyst, hemorrghaic,  ANESTHESIA: General ESTIMATED BLOOD LOSS: 50 ml URINE OUTPUT: 25 ml SPECIMENS: Right  ectopic gestation, Left ovarian cyst wall COMPLICATIONS: None immediate  PROCEDURE IN DETAIL:  The patient was taken to the operating room where general anesthesia was administered and was found to be adequate.  She was placed in the dorsal lithotomy position, and was prepped and draped in a sterile manner.  A Foley catheter was inserted into her bladder and attached to constant drainage and a hulka tenaculum was placed vaginally for any manipulation purposes .  After an adequate timeout was performed, attention was then turned to the patient's abdomen where a 5-mm skin incision was made on the umbilical fold.    After an adequate timeout was performed, attention was turned to the abdomen where an umbilical incision was made with the scalpel.  Veress needle is placed with  confirmation using the hanging drop technique.   The abdomen was then insufflated with carbon dioxide gas and adequate pneumoperitoneum was obtained.  The 5-mm trocar and sleeve were then advanced without difficulty with the laparoscope under direct visualization into the abdomen.   A survey of the patient's pelvis and abdomen revealed the findings above.  A 5-mm right lower quadrant port was then placed under direct visualization. Additional 5-mm trocar placed in suprapubic region and RLQ lateral to the inferior epigastric blood vessels.    Attention was then turned to the right fallopian tube where a linear salpingostomy incision is made with aspiration of the ectopic pregnancy contents.  Good hemostasis was noted. The left ovarian cyst was incised and the cyst wall dissected free and removed.    The abdomen was desufflated, and all instruments were removed.  All skin incisions were closed Dermabond. The patient tolerated the procedure well.  All instruments, needles, and sponge counts were correct x 2. The patient was taken to the recovery room in stable condition.   The patient will be discharged to home as per PACU criteria.  Routine postoperative instructions given.

## 2020-03-11 ENCOUNTER — Encounter: Payer: Self-pay | Admitting: Obstetrics & Gynecology

## 2020-03-11 MED ORDER — OXYCODONE-ACETAMINOPHEN 5-325 MG PO TABS: 1.0000 | ORAL_TABLET | ORAL | 0 refills | Status: DC | PRN

## 2020-03-11 NOTE — Progress Notes (Signed)
All discharge instructions reviewed with pt; pt and pt's mom asking about a "doctor's note for work"; they said Dr. Kenton Kingfisher would write a note; RN asked pt or her mom to call [facility] first thing Monday morning and leave a message for Dr. Kenton Kingfisher regarding work note; pt discharged via wheelchair and escorted by RN to medical mall entrance; pt going home with her mom

## 2020-03-11 NOTE — Discharge Summary (Signed)
Gynecology Physician Postoperative Discharge Summary  Patient ID: Emily Levy MRN: [id number] DOB/AGE: [birth year] 29 y.o.  Admit Date: 03/10/2020 Discharge Date: 03/11/2020  Preoperative Diagnoses: Ectopic pregnancy and ovarian cyst  Procedures: Procedure(s) (LRB): DIAGNOSTIC LAPAROSCOPY WITH REMOVAL OF ECTOPIC PREGNANCY Right Salpingostomy Left Ovarian Cystectomy (N/A)  Significant Labs: CBC Latest Ref Rng & Units 03/10/2020 03/07/2020 02/24/2020  WBC 4.0 - 10.5 K/uL 7.8 8.1 9.0  Hemoglobin 12.0 - 15.0 g/dL 10.2(L) 10.2(L) 10.9(L)  Hematocrit 36 - 46 % 29.5(L) 31.2(L) 32.8(L)  Platelets 150 - 400 K/uL 195 229 245    Hospital Course:  Emily Levy is a 29 y.o. G1P0010  admitted for scheduled surgery.  She underwent the procedures as mentioned above, her operation was uncomplicated. For further details about surgery, please refer to the operative report. Patient had an uncomplicated postoperative course. By time of discharge on POD#1, her pain was controlled on oral pain medications; she was ambulating, voiding without difficulty, tolerating regular diet and passing flatus. She was deemed stable for discharge to home.   Discharge Exam: Blood pressure (!) 100/58, pulse (!) 55, temperature 97.9 F (36.6 C), temperature source Oral, resp. rate 18, height 5\' 5"  (1.651 m), weight 68.9 kg, last menstrual period 03/10/2020, SpO2 100 %, unknown if currently breastfeeding. General appearance: alert and no distress  Resp: clear to auscultation bilaterally  Cardio: regular rate and rhythm  GI: soft, min tender; bowel sounds normal; no masses, no organomegaly.  Incision: C/D/I, no erythema, no drainage noted Pelvic: scant blood on pad  Extremities: extremities normal, atraumatic, no cyanosis or edema and Homans sign is negative, no sign of DVT  Discharged Condition: Stable  Disposition: Discharge disposition: 01-Home or Self Care       Discharge Instructions    Call MD for:   persistant nausea and vomiting   Complete by: As directed    Call MD for:  redness, tenderness, or signs of infection (pain, swelling, redness, odor or green/yellow discharge around incision site)   Complete by: As directed    Call MD for:  severe uncontrolled pain   Complete by: As directed    Call MD for:  temperature >100.4   Complete by: As directed    Diet - low sodium heart healthy   Complete by: As directed    Discharge instructions   Complete by: As directed    Resume activities according to discharge instruction sheets   If the dressing is still on your incision site when you go home, remove it on the third day after your surgery date. Remove dressing if it begins to fall off, or if it is dirty or damaged before the third day.   Complete by: As directed    Increase activity slowly   Complete by: As directed      Allergies as of 03/11/2020      Reactions   Sulfa Antibiotics Hives      Medication List    TAKE these medications   oxyCODONE-acetaminophen 5-325 MG tablet Commonly known as: PERCOCET/ROXICET Take 1-2 tablets by mouth every 4 (four) hours as needed for moderate pain.            Discharge Care Instructions  (From admission, onward)         Start     Ordered   03/11/20 0000  If the dressing is still on your incision site when you go home, remove it on the third day after your surgery date. Remove dressing if it begins to  fall off, or if it is dirty or damaged before the third day.        03/11/20 0813          Follow-up Information    Gae Dry, MD. Schedule an appointment as soon as possible for a visit in 2 week(s).   Specialty: Obstetrics and Gynecology Contact information: [address] [place] [address] [phone]               Barnett Applebaum, MD

## 2020-03-15 LAB — SURGICAL PATHOLOGY

## 2020-03-22 NOTE — Anesthesia Postprocedure Evaluation (Signed)
Anesthesia Post Note  Patient: Emily Levy  Procedure(s) Performed: DIAGNOSTIC LAPAROSCOPY WITH REMOVAL OF ECTOPIC PREGNANCY Right Salpingostomy Left Ovarian Cystectomy (N/A )  Patient location during evaluation: PACU Anesthesia Type: General Level of consciousness: awake and alert Pain management: pain level controlled Vital Signs Assessment: post-procedure vital signs reviewed and stable Respiratory status: spontaneous breathing, nonlabored ventilation and respiratory function stable Cardiovascular status: blood pressure returned to baseline and stable Postop Assessment: no apparent nausea or vomiting Anesthetic complications: no   No complications documented.   Last Vitals:  Vitals:   03/11/20 1613 03/11/20 1931  BP: (!) 90/58 (!) 81/65  Pulse: 70 (!) 58  Resp: 18 20  Temp: 36.9 C 36.9 C  SpO2: 99% 100%    Last Pain:  Vitals:   03/11/20 2030  TempSrc:   PainSc: [place]

## 2020-03-25 ENCOUNTER — Encounter: Payer: Self-pay | Admitting: Obstetrics & Gynecology

## 2020-03-25 ENCOUNTER — Ambulatory Visit (INDEPENDENT_AMBULATORY_CARE_PROVIDER_SITE_OTHER): Payer: Self-pay | Admitting: Obstetrics & Gynecology

## 2020-03-25 ENCOUNTER — Other Ambulatory Visit: Payer: Self-pay

## 2020-03-25 VITALS — BP 120/80 | Ht 65.0 in | Wt 151.0 lb

## 2020-03-25 DIAGNOSIS — Z9889 Other specified postprocedural states: Secondary | ICD-10-CM

## 2020-03-25 NOTE — Progress Notes (Signed)
°  Postoperative Follow-up Patient presents post op from laparoscopy and ovarian cystectomy and salpingostomy for right ectopic and left ovarian cyst, 2 weeks ago.  Subjective: Patient reports marked improvement in her preop symptoms. Eating a regular diet without difficulty. The patient is not having any pain.  Activity: normal activities of daily living. Patient reports additional symptom's since surgery of None.  Objective: BP 120/80    Ht 5\' 5"  (1.651 m)    Wt 151 lb (68.5 kg)    LMP 03/10/2020    BMI 25.13 kg/m  Physical Exam Constitutional:      General: She is not in acute distress.    Appearance: She is well-developed.  Cardiovascular:     Rate and Rhythm: Normal rate.  Pulmonary:     Effort: Pulmonary effort is normal.  Abdominal:     General: There is no distension.     Palpations: Abdomen is soft.     Tenderness: There is no abdominal tenderness.     Comments: Incision Healing Well   Musculoskeletal:        General: Normal range of motion.  Neurological:     Mental Status: She is alert and oriented to person, place, and time.     Cranial Nerves: No cranial nerve deficit.  Skin:    General: Skin is warm and dry.     Assessment: s/p :  Lap for ectopic and cyst progressing well  Plan: Patient has done well after surgery with no apparent complications.  I have discussed the post-operative course to date, and the expected progress moving forward.  The patient understands what complications to be concerned about.  I will see the patient in routine follow up, or sooner if needed.    Activity plan: No restriction. Plans future pregnancy attempts.  Discussed risks and recurrences.  Await next normal period.  Annual/ PAP soon  Hoyt Koch 03/25/2020, 10:25 AM

## 2020-04-18 ENCOUNTER — Other Ambulatory Visit: Payer: Self-pay

## 2020-04-18 ENCOUNTER — Other Ambulatory Visit (HOSPITAL_COMMUNITY)
Admission: RE | Admit: 2020-04-18 | Discharge: 2020-04-18 | Disposition: A | Discharge status: Home / Self Care | Payer: Self-pay | Source: Ambulatory Visit | Attending: Obstetrics & Gynecology | Admitting: Obstetrics & Gynecology

## 2020-04-18 ENCOUNTER — Ambulatory Visit (INDEPENDENT_AMBULATORY_CARE_PROVIDER_SITE_OTHER): Payer: Self-pay | Admitting: Obstetrics & Gynecology

## 2020-04-18 ENCOUNTER — Encounter: Payer: Self-pay | Admitting: Obstetrics & Gynecology

## 2020-04-18 VITALS — BP 100/60 | Ht 65.0 in | Wt 154.0 lb

## 2020-04-18 DIAGNOSIS — Z124 Encounter for screening for malignant neoplasm of cervix: Secondary | ICD-10-CM

## 2020-04-18 DIAGNOSIS — Z01419 Encounter for gynecological examination (general) (routine) without abnormal findings: Secondary | ICD-10-CM

## 2020-04-18 NOTE — Progress Notes (Signed)
HPI:      [name] is a 29 y.o. G1P0010 who LMP was No LMP recorded., she presents today for her annual examination. The patient has no complaints today, other than continued nerve sensitivity or pain around her suprapubic incision from laparoscopy last month, other incisions are normal to touch. No bruising or discharge or mass.. The patient is sexually active. Her last pap: was normal. The patient does perform self breast exams.  There is no notable family history of breast or ovarian cancer in her family.  The patient has regular exercise: yes.  The patient denies current symptoms of depression.    GYN History: Contraception: none  PMHx: Past Medical History:  Diagnosis Date   Abnormal Pap smear of cervix    all previous paps except for one in 2016   Asthma    Ectopic pregnancy    Pelvic pain    Restless leg    Past Surgical History:  Procedure Laterality Date   APPENDECTOMY  2016   DIAGNOSTIC LAPAROSCOPY WITH REMOVAL OF ECTOPIC PREGNANCY N/A 03/10/2020   Procedure: DIAGNOSTIC LAPAROSCOPY WITH REMOVAL OF ECTOPIC PREGNANCY Right Salpingostomy Left Ovarian Cystectomy;  Surgeon: Gae Dry, MD;  Location: [facility] ORS;  Service: Gynecology;  Laterality: N/A;   HERNIA REPAIR     Family History  Problem Relation Age of Onset   Heart disease Maternal Grandmother    Social History   Tobacco Use   Smoking status: Never Smoker   Smokeless tobacco: Never Used  Vaping Use   Vaping Use: Former  Substance Use Topics   Alcohol use: No   Drug use: No   No current outpatient medications on file. Allergies: Sulfa antibiotics  Review of Systems  Constitutional: Negative for chills, fever and malaise/fatigue.  HENT: Negative for congestion, sinus pain and sore throat.   Eyes: Negative for blurred vision and pain.  Respiratory: Negative for cough and wheezing.   Cardiovascular: Negative for chest pain and leg swelling.  Gastrointestinal: Negative for  abdominal pain, constipation, diarrhea, heartburn, nausea and vomiting.  Genitourinary: Negative for dysuria, frequency, hematuria and urgency.  Musculoskeletal: Negative for back pain, joint pain, myalgias and neck pain.  Skin: Negative for itching and rash.  Neurological: Negative for dizziness, tremors and weakness.  Endo/Heme/Allergies: Does not bruise/bleed easily.  Psychiatric/Behavioral: Negative for depression. The patient is not nervous/anxious and does not have insomnia.     Objective: BP 100/60    Ht 5\' 5"  (1.651 m)    Wt 154 lb (69.9 kg)    BMI 25.63 kg/m   Filed Weights   04/18/20 1524  Weight: 154 lb (69.9 kg)   Body mass index is 25.63 kg/m. Physical Exam Constitutional:      General: She is not in acute distress.    Appearance: She is well-developed.  Genitourinary:     Pelvic exam was performed with patient supine.     Vagina, uterus and rectum normal.     No lesions in the vagina.     No vaginal bleeding.     No cervical motion tenderness, friability, lesion or polyp.     Uterus is mobile.     Uterus is not enlarged.     No uterine mass detected.    Uterus is midaxial.     No right or left adnexal mass present.     Right adnexa not tender.     Left adnexa not tender.  HENT:     Head: Normocephalic and atraumatic.  No laceration.     Right Ear: Hearing normal.     Left Ear: Hearing normal.     Mouth/Throat:     Pharynx: Uvula midline.  Eyes:     Pupils: Pupils are equal, round, and reactive to light.  Neck:     Thyroid: No thyromegaly.  Cardiovascular:     Rate and Rhythm: Normal rate and regular rhythm.     Heart sounds: No murmur heard.  No friction rub. No gallop.   Pulmonary:     Effort: Pulmonary effort is normal. No respiratory distress.     Breath sounds: Normal breath sounds. No wheezing.  Chest:     Breasts:        Right: No mass, skin change or tenderness.        Left: No mass, skin change or tenderness.  Abdominal:     General: Bowel  sounds are normal. There is no distension.     Palpations: Abdomen is soft.     Tenderness: There is no abdominal tenderness. There is no rebound.  Musculoskeletal:        General: Normal range of motion.     Cervical back: Normal range of motion and neck supple.  Neurological:     Mental Status: She is alert and oriented to person, place, and time.     Cranial Nerves: No cranial nerve deficit.  Skin:    General: Skin is warm and dry.  Psychiatric:        Judgment: Judgment normal.  Vitals reviewed.     Assessment:  ANNUAL EXAM 1. Women's annual routine gynecological examination   2. Screening for cervical cancer      Screening Plan:            1.  Cervical Screening-  Pap smear done today  2.  Labs managed by PCP  3. Counseling for contraception: no method  The pregnancy intention screening data noted above was reviewed. Potential methods of contraception were discussed. The patient elected to proceed with Pregnant/Seeking Pregnancy.      F/U  Return in about 1 year (around 04/18/2021) for Annual.  Barnett Applebaum, MD, [facility], [facility] 04/18/2020  4:17 PM

## 2020-04-18 NOTE — Patient Instructions (Signed)
PAP every year Labs yearly (with PCP)  Thank you for choosing [facility]. As part of our ongoing efforts to improve patient experience, we would appreciate your feedback. Please fill out the short survey that you will receive by mail or MyChart. Your opinion is important to [place]! - Dr. Kenton Kingfisher

## 2020-04-20 LAB — CYTOLOGY - PAP: Diagnosis: NEGATIVE

## 2020-07-15 ENCOUNTER — Emergency Department
Admission: EM | Admit: 2020-07-15 | Discharge: 2020-07-15 | Disposition: A | Discharge status: Home / Self Care | Payer: Self-pay | Attending: Student in an Organized Health Care Education/Training Program | Admitting: Student in an Organized Health Care Education/Training Program

## 2020-07-15 ENCOUNTER — Other Ambulatory Visit: Payer: Self-pay

## 2020-07-15 ENCOUNTER — Emergency Department: Payer: Self-pay

## 2020-07-15 ENCOUNTER — Encounter: Payer: Self-pay | Admitting: Emergency Medicine

## 2020-07-15 DIAGNOSIS — Z23 Encounter for immunization: Secondary | ICD-10-CM | POA: Insufficient documentation

## 2020-07-15 DIAGNOSIS — W25XXXA Contact with sharp glass, initial encounter: Secondary | ICD-10-CM | POA: Insufficient documentation

## 2020-07-15 DIAGNOSIS — S90851A Superficial foreign body, right foot, initial encounter: Secondary | ICD-10-CM | POA: Insufficient documentation

## 2020-07-15 DIAGNOSIS — Y92019 Unspecified place in single-family (private) house as the place of occurrence of the external cause: Secondary | ICD-10-CM | POA: Insufficient documentation

## 2020-07-15 MED ORDER — LIDOCAINE HCL (PF) 1 % IJ SOLN
5.0000 mL | Freq: Once | INTRAMUSCULAR | Status: DC
  Filled 2020-07-15: qty 5

## 2020-07-15 MED ORDER — TETANUS-DIPHTH-ACELL PERTUSSIS 5-2.5-18.5 LF-MCG/0.5 IM SUSY
0.5000 mL | PREFILLED_SYRINGE | Freq: Once | INTRAMUSCULAR | Status: AC
  Administered 2020-07-15: 0.5 mL via INTRAMUSCULAR
  Filled 2020-07-15: qty 0.5

## 2020-07-15 MED ORDER — DOXYCYCLINE MONOHYDRATE 100 MG PO TABS
100.0000 mg | ORAL_TABLET | Freq: Two times a day (BID) | ORAL | 0 refills | Status: AC
Start: 2020-07-15 — End: 2020-07-22

## 2020-07-15 NOTE — ED Triage Notes (Signed)
Pt to ED via POV, states glass to bottom of R foot, pt states increased pain with walking/pressure on her foot. Pt states stepped on glass several days ago.

## 2020-07-15 NOTE — ED Provider Notes (Signed)
[facility] Emergency Department Provider Note  ___________________________________________   Event Date/Time   First MD Initiated Contact with Patient 07/15/20 1559     (approximate)  I have reviewed the triage vital signs and the nursing notes.   HISTORY  Chief Complaint FB R Foot  HPI Emily Levy is a 29 y.o. female patient is a 29 year old female who reports to the emergency department for evaluation of a suspected glass in her right foot.  She states that 2 days ago, she heard a loud noise, and went to her sister's room where a mirror have been broken.  At that time she placed shoes on to protect her feet while it was cleaned out, however she believes she may have stepped on glass prior to she is being put on.  She has had continual sharp pain on her right heel with stepping since that time.  She denies any other injuries or pain anywhere else in her foot or ankle from incident.  Last Tdap was greater than 5 years ago.         Past Medical History:  Diagnosis Date  . Abnormal Pap smear of cervix    all previous paps except for one in 2016  . Asthma   . Ectopic pregnancy   . Pelvic pain   . Restless leg     Patient Active Problem List   Diagnosis Date Noted  . Ectopic pregnancy of right ovary 03/10/2020  . Ovarian cyst, left 03/10/2020  . Pelvic pain 03/10/2020  . Restless leg syndrome 10/02/2016  . Sinus tachycardia 09/12/2015    Past Surgical History:  Procedure Laterality Date  . APPENDECTOMY  2016  . DIAGNOSTIC LAPAROSCOPY WITH REMOVAL OF ECTOPIC PREGNANCY N/A 03/10/2020   Procedure: DIAGNOSTIC LAPAROSCOPY WITH REMOVAL OF ECTOPIC PREGNANCY Right Salpingostomy Left Ovarian Cystectomy;  Surgeon: Gae Dry, MD;  Location: [facility] ORS;  Service: Gynecology;  Laterality: N/A;  . HERNIA REPAIR      Prior to Admission medications   Medication Sig Start Date End Date Taking? Authorizing Provider  doxycycline (ADOXA) 100 MG tablet  Take 1 tablet (100 mg total) by mouth 2 (two) times daily for 7 days. 07/15/20 07/22/20  Marlana Salvage, PA    Allergies Sulfa antibiotics  Family History  Problem Relation Age of Onset  . Heart disease Maternal Grandmother     Social History Social History   Tobacco Use  . Smoking status: Never Smoker  . Smokeless tobacco: Never Used  Vaping Use  . Vaping Use: Former  Substance Use Topics  . Alcohol use: No  . Drug use: No    Review of Systems Constitutional: No fever/chills Eyes: No visual changes. ENT: No sore throat. Cardiovascular: Denies chest pain. Respiratory: Denies shortness of breath. Gastrointestinal: No abdominal pain.  No nausea, no vomiting.  No diarrhea.  No constipation. Genitourinary: Negative for dysuria. Musculoskeletal: + Right foot pain, negative for back pain. Skin: + Suspected foreign body right foot, negative for rash. Neurological: Negative for headaches, focal weakness or numbness.  ____________________________________________   PHYSICAL EXAM:  VITAL SIGNS: ED Triage Vitals [07/15/20 1447]  Enc Vitals Group     BP 102/70     Pulse Rate 64     Resp 18     Temp 98.3 F (36.8 C)     Temp Source Oral     SpO2 99 %     Weight 144 lb (65.3 kg)     Height 5\' 5"  (1.651  m)     Head Circumference      Peak Flow      Pain Score 3     Pain Loc      Pain Edu?      Excl. in [place]?     Constitutional: Alert and oriented. Well appearing and in no acute distress. Eyes: Conjunctivae are normal.  EOMI. Head: Atraumatic. Nose: No congestion/rhinnorhea. Mouth/Throat: Mucous membranes are moist.  Neck: No stridor.   Cardiovascular: Normal rate, regular rhythm.   Good peripheral circulation. Respiratory: Normal respiratory effort.  No retractions.  Musculoskeletal: There is tenderness to palpation on the right plantar surface of the foot, nearby the site of plantar fascia insertion.  No clear entry point is identified for suspected foreign  body, however there is a palpable small mass just under the surface. Neurologic:  Normal speech and language. No gross focal neurologic deficits are appreciated. No gait instability. Skin:  Skin is warm, dry and intact. No rash noted. Psychiatric: Mood and affect are normal. Speech and behavior are normal.  ____________________________________________  RADIOLOGY I, Marlana Salvage, personally viewed and evaluated these images (plain radiographs) as part of my medical decision making, as well as reviewing the written report by the radiologist.  ED provider interpretation: Foreign body not identified on radiographs of the right foot  Official radiology report(s): DG Foot Complete Right  Result Date: 07/15/2020 CLINICAL DATA:  Stepped on glass EXAM: RIGHT FOOT COMPLETE - 3+ VIEW COMPARISON:  April 16, 2016 FINDINGS: Frontal, oblique, and lateral views were obtained. No radiopaque foreign body evident. No fracture or dislocation. Joint spaces appear normal. No erosive change. IMPRESSION: No radiopaque foreign body. No fracture or dislocation. No evident arthropathy. Electronically Signed   By: Lowella Grip III M.D.   On: 07/15/2020 15:06    ____________________________________________   PROCEDURES  Procedure(s) performed (including Critical Care):  .Foreign Body Removal  Date/Time: 07/15/2020 4:53 PM Performed by: Marlana Salvage, PA Authorized by: Marlana Salvage, PA  Consent: Verbal consent obtained. Risks and benefits: risks, benefits and alternatives were discussed Consent given by: patient Patient understanding: patient states understanding of the procedure being performed Imaging studies: imaging studies available Patient identity confirmed: verbally with patient Body area: skin General location: lower extremity Location details: right foot Anesthesia: local infiltration  Anesthesia: Local Anesthetic: lidocaine 1% without epinephrine Anesthetic total: 5  mL  Sedation: Patient sedated: no  Patient restrained: no Patient cooperative: yes Localization method: probed Removal mechanism: scalpel, hemostat, irrigation and forceps Tendon involvement: none Depth: subcutaneous Complexity: simple 0 objects recovered. Objects recovered: 0 Post-procedure assessment: foreign body not removed Patient tolerance: patient tolerated the procedure well with no immediate complications     ____________________________________________   INITIAL IMPRESSION / ASSESSMENT AND PLAN / ED COURSE  As part of my medical decision making, I reviewed the following data within the [facility] notes reviewed and incorporated, Radiograph reviewed and Notes from prior ED visits        Patient is a 29 year old female presents to the emergency department for evaluation suspected foreign body in the plantar surface of the right foot.  See HPI for further details.  On physical exam, there does seem to be a palpable small mass in the superficial right heel.  Options discussed with the patient include leaving and allowing her body to expel it on its own versus attempted removal.  Discussed with the patient that a foreign body removal was not guaranteed even if it was  attempted.  Patient was amenable with an attempt, however despite the attempt it was unable to be removed.  Patient was placed on prophylactic antibiotics and her Tdap was updated.  Patient was educated on signs and symptoms of infection patient will return if she has any acute worsening.  Patient is amenable with this time is plan and is stable this time for outpatient therapy.      ____________________________________________   FINAL CLINICAL IMPRESSION(S) / ED DIAGNOSES  Final diagnoses:  Foreign body in right foot, initial encounter     ED Discharge Orders         Ordered    doxycycline (ADOXA) 100 MG tablet  2 times daily        07/15/20 1645          *Please note:   Emily Levy was evaluated in Emergency Department on 07/15/2020 for the symptoms described in the history of present illness. She was evaluated in the context of the global COVID-19 pandemic, which necessitated consideration that the patient might be at risk for infection with the SARS-CoV-2 virus that causes COVID-19. Institutional protocols and algorithms that pertain to the evaluation of patients at risk for COVID-19 are in a state of rapid change based on information released by regulatory bodies including the CDC and federal and state organizations. These policies and algorithms were followed during the patient's care in the ED.  Some ED evaluations and interventions may be delayed as a result of limited staffing during and the pandemic.*   Note:  This document was prepared using Dragon voice recognition software and may include unintentional dictation errors.    Marlana Salvage, PA 07/15/20 1656    Merlyn Lot, MD 07/15/20 [phone]

## 2020-07-15 NOTE — ED Notes (Signed)
Dressing to right heel wound

## 2020-12-19 ENCOUNTER — Encounter: Payer: Self-pay | Admitting: Obstetrics & Gynecology

## 2020-12-19 ENCOUNTER — Ambulatory Visit (INDEPENDENT_AMBULATORY_CARE_PROVIDER_SITE_OTHER): Payer: Self-pay | Admitting: Obstetrics & Gynecology

## 2020-12-19 ENCOUNTER — Other Ambulatory Visit: Payer: Self-pay

## 2020-12-19 ENCOUNTER — Other Ambulatory Visit (HOSPITAL_COMMUNITY)
Admission: RE | Admit: 2020-12-19 | Discharge: 2020-12-19 | Disposition: A | Discharge status: Home / Self Care | Payer: Medicaid Other | Source: Ambulatory Visit | Attending: Obstetrics & Gynecology | Admitting: Obstetrics & Gynecology

## 2020-12-19 VITALS — BP 110/60 | Wt 139.0 lb

## 2020-12-19 DIAGNOSIS — Z3491 Encounter for supervision of normal pregnancy, unspecified, first trimester: Secondary | ICD-10-CM | POA: Insufficient documentation

## 2020-12-19 DIAGNOSIS — Z8759 Personal history of other complications of pregnancy, childbirth and the puerperium: Secondary | ICD-10-CM | POA: Insufficient documentation

## 2020-12-19 DIAGNOSIS — N926 Irregular menstruation, unspecified: Secondary | ICD-10-CM

## 2020-12-19 DIAGNOSIS — Z3A01 Less than 8 weeks gestation of pregnancy: Secondary | ICD-10-CM

## 2020-12-19 LAB — POCT URINE PREGNANCY: Preg Test, Ur: POSITIVE — AB

## 2020-12-19 NOTE — Progress Notes (Signed)
12/19/2020   Chief Complaint: Missed period  History of Present Illness: Emily Levy is a 30 y.o. G2P0010 [redacted]w[redacted]d based on Patient's last menstrual period was 11/04/2020. with an Estimated Date of Delivery: 08/11/21, with the above CC.   She has a prior h/o RIGHT ECTOPIC PREGNANCY in SEPT 2021.  She has been having regular cycles without contraception with a late period in March and LMP 11/04/2020.  She has Positive signs or symptoms of nausea/vomiting of pregnancy. She has Negative signs or symptoms of miscarriage or preterm labor She identifies Negative Zika risk factors for her and her partner On any different medications around the time she conceived/early pregnancy: No  History of varicella: Yes   ROS: A 12-point review of systems was performed and negative, except as stated in the above HPI.  OBGYN History: As per HPI. OB History  Gravida Para Term Preterm AB Living  2 0 0 0 1 0  SAB IAB Ectopic Multiple Live Births  0 0 1 0      # Outcome Date GA Lbr Len/2nd Weight Sex Delivery Anes PTL Lv  2 Current           1 Ectopic 02/24/20            Any issues with any prior pregnancies: R Ectopic, s/p MTX and then Laparoscopy Any prior children are healthy, doing well, without any problems or issues: not applicable History of pap smears: Yes. Last pap smear 03/2020. Abnormal: no  History of STIs: No   Past Medical History: Past Medical History:  Diagnosis Date  . Abnormal Pap smear of cervix    all previous paps except for one in 2016  . Asthma   . Ectopic pregnancy   . Pelvic pain   . Restless leg     Past Surgical History: Past Surgical History:  Procedure Laterality Date  . APPENDECTOMY  2016  . DIAGNOSTIC LAPAROSCOPY WITH REMOVAL OF ECTOPIC PREGNANCY N/A 03/10/2020   Procedure: DIAGNOSTIC LAPAROSCOPY WITH REMOVAL OF ECTOPIC PREGNANCY Right Salpingostomy Left Ovarian Cystectomy;  Surgeon: Gae Dry, MD;  Location: [facility] ORS;  Service: Gynecology;  Laterality: N/A;  .  HERNIA REPAIR      Family History:  Family History  Problem Relation Age of Onset  . Heart disease Maternal Grandmother    She denies any female cancers, bleeding or blood clotting disorders.  She denies any history of mental retardation, birth defects or genetic disorders in her or the FOB's history  Social History:  Social History   Socioeconomic History  . Marital status: Single    Spouse name: Not on file  . Number of children: Not on file  . Years of education: Not on file  . Highest education level: Not on file  Occupational History  . Not on file  Tobacco Use  . Smoking status: Never Smoker  . Smokeless tobacco: Never Used  Vaping Use  . Vaping Use: Former  Substance and Sexual Activity  . Alcohol use: No  . Drug use: No  . Sexual activity: Yes    Birth control/protection: None  Other Topics Concern  . Not on file  Social History Narrative  . Not on file   Social Determinants of Health   Financial Resource Strain: Not on file  Food Insecurity: Not on file  Transportation Needs: Not on file  Physical Activity: Not on file  Stress: Not on file  Social Connections: Not on file  Intimate Partner Violence: Not on file  Any pets in the household: no  Allergy: Allergies  Allergen Reactions  . Sulfa Antibiotics Hives    Current Outpatient Medications: No current outpatient medications on file.   Physical Exam:   BP 110/60   Wt 139 lb (63 kg)   LMP 11/04/2020   BMI 23.13 kg/m  Body mass index is 23.13 kg/m. Constitutional: Well nourished, well developed female in no acute distress.  Neck:  Supple, normal appearance, and no thyromegaly  Cardiovascular: S1, S2 normal, no murmur, rub or gallop, regular rate and rhythm Respiratory:  Clear to auscultation bilateral. Normal respiratory effort Abdomen: positive bowel sounds and no masses, hernias; diffusely non tender to palpation, non distended Breasts: breasts appear normal, no suspicious masses, no  skin or nipple changes or axillary nodes. Neuro/Psych:  Normal mood and affect.  Skin:  Warm and dry.  Lymphatic:  No inguinal lymphadenopathy.   Pelvic exam: is not limited by body habitus EGBUS: within normal limits, Vagina: within normal limits and with no blood in the vault, Cervix: normal appearing cervix without discharge or lesions, closed/long/high, Uterus:  enlarged: 6 weeks (min), and Adnexa:  no mass, fullness, tenderness  Assessment: Emily Levy is a 30 y.o. G2P0010 [redacted]w[redacted]d based on Patient's last menstrual period was 11/04/2020. with an Estimated Date of Delivery: 08/11/21,  for prenatal care.  Plan:  1) Avoid alcoholic beverages. 2) Patient encouraged not to smoke.  3) Discontinue the use of all non-medicinal drugs and chemicals.  4) Take prenatal vitamins daily.  5) Seatbelt use advised 6) Nutrition, food safety (fish, cheese advisories, and high nitrite foods) and exercise discussed. 7) Hospital and practice style delivering at [facility] discussed  8) Patient is asked about travel to areas at risk for the [place] virus, and counseled to avoid travel and exposure to mosquitoes or sexual partners who may have themselves been exposed to the virus. Testing is discussed, and will be ordered as appropriate.  9) Childbirth classes at [facility] advised 10) Genetic Screening, such as with 1st Trimester Screening, cell free fetal DNA, AFP testing, and Ultrasound, as well as with amniocentesis and CVS as appropriate, is discussed with patient. She plans to consider genetic testing this pregnancy. 11) Beta hCG levels today and Wednesday, then [place] as based on results.  Problem list reviewed and updated.  Barnett Applebaum, MD, [facility], [facility] 12/19/2020  9:53 AM

## 2020-12-19 NOTE — Patient Instructions (Signed)
Thank you for choosing [facility]. As part of our ongoing efforts to improve patient experience, we would appreciate your feedback. Please fill out the short survey that you will receive by mail or MyChart. Your opinion is important to us! -Dr Liberato Stansbery   First Trimester of Pregnancy  The first trimester of pregnancy starts on the first day of your last menstrual period until the end of week 12. This is months 1 through 3 of pregnancy. A week after a sperm fertilizes an egg, the egg will implant into the wall of the uterus and begin to develop into a baby. By the end of 12 weeks, all the baby's organs will be formed and the baby will be 2-3 inches in size. Body changes during your first trimester Your body goes through many changes during pregnancy. The changes vary and generally return to normal after your baby is born. Physical changes  You may gain or lose weight.  Your breasts may begin to grow larger and become tender. The tissue that surrounds your nipples (areola) may become darker.  Dark spots or blotches (chloasma or mask of pregnancy) may develop on your face.  You may have changes in your hair. These can include thickening or thinning of your hair or changes in texture. Health changes  You may feel nauseous, and you may vomit.  You may have heartburn.  You may develop headaches.  You may develop constipation.  Your gums may bleed and may be sensitive to brushing and flossing. Other changes  You may tire easily.  You may urinate more often.  Your menstrual periods will stop.  You may have a loss of appetite.  You may develop cravings for certain kinds of food.  You may have changes in your emotions from day to day.  You may have more vivid and strange dreams. Follow these instructions at home: Medicines  Follow your health care provider's instructions regarding medicine use. Specific medicines may be either safe or unsafe to take during pregnancy. Do not take  any medicines unless told to by your health care provider.  Take a prenatal vitamin that contains at least 600 micrograms (mcg) of folic acid. Eating and drinking  Eat a healthy diet that includes fresh fruits and vegetables, whole grains, good sources of protein such as meat, eggs, or tofu, and low-fat dairy products.  Avoid raw meat and unpasteurized juice, milk, and cheese. These carry germs that can harm you and your baby.  If you feel nauseous or you vomit: ? Eat 4 or 5 small meals a day instead of 3 large meals. ? Try eating a few soda crackers. ? Drink liquids between meals instead of during meals.  You may need to take these actions to prevent or treat constipation: ? Drink enough fluid to keep your urine pale yellow. ? Eat foods that are high in fiber, such as beans, whole grains, and fresh fruits and vegetables. ? Limit foods that are high in fat and processed sugars, such as fried or sweet foods. Activity  Exercise only as directed by your health care provider. Most people can continue their usual exercise routine during pregnancy. Try to exercise for 30 minutes at least 5 days a week.  Stop exercising if you develop pain or cramping in the lower abdomen or lower back.  Avoid exercising if it is very hot or humid or if you are at high altitude.  Avoid heavy lifting.  If you choose to, you may have sex unless your   health care provider tells you not to. Relieving pain and discomfort  Wear a good support bra to relieve breast tenderness.  Rest with your legs elevated if you have leg cramps or low back pain.  If you develop bulging veins (varicose veins) in your legs: ? Wear support hose as told by your health care provider. ? Elevate your feet for 15 minutes, 3-4 times a day. ? Limit salt in your diet. Safety  Wear your seat belt at all times when driving or riding in a car.  Talk with your health care provider if someone is verbally or physically abusive to  you.  Talk with your health care provider if you are feeling sad or have thoughts of hurting yourself. Lifestyle  Do not use hot tubs, steam rooms, or saunas.  Do not douche. Do not use tampons or scented sanitary pads.  Do not use herbal remedies, alcohol, illegal drugs, or medicines that are not approved by your health care provider. Chemicals in these products can harm your baby.  Do not use any products that contain nicotine or tobacco, such as cigarettes, e-cigarettes, and chewing tobacco. If you need help quitting, ask your health care provider.  Avoid cat litter boxes and soil used by cats. These carry germs that can cause birth defects in the baby and possibly loss of the unborn baby (fetus) by miscarriage or stillbirth. General instructions  During routine prenatal visits in the first trimester, your health care provider will do a physical exam, perform necessary tests, and ask you how things are going. Keep all follow-up visits. This is important.  Ask for help if you have counseling or nutritional needs during pregnancy. Your health care provider can offer advice or refer you to specialists for help with various needs.  Schedule a dentist appointment. At home, brush your teeth with a soft toothbrush. Floss gently.  Write down your questions. Take them to your prenatal visits. Where to find more information  American Pregnancy Association: americanpregnancy.Queets and Gynecologists: PoolDevices.com.pt  Office on [employer]: KeywordPortfolios.com.br Contact a health care provider if you have:  Dizziness.  A fever.  Mild pelvic cramps, pelvic pressure, or nagging pain in the abdominal area.  Nausea, vomiting, or diarrhea that lasts for 24 hours or longer.  A bad-smelling vaginal discharge.  Pain when you urinate.  Known exposure to a contagious illness, such as chickenpox, measles, Zika virus, HIV, or  hepatitis. Get help right away if you have:  Spotting or bleeding from your vagina.  Severe abdominal cramping or pain.  Shortness of breath or chest pain.  Any kind of trauma, such as from a fall or a car crash.  New or increased pain, swelling, or redness in an arm or leg. Summary  The first trimester of pregnancy starts on the first day of your last menstrual period until the end of week 12 (months 1 through 3).  Eating 4 or 5 small meals a day rather than 3 large meals may help to relieve nausea and vomiting.  Do not use any products that contain nicotine or tobacco, such as cigarettes, e-cigarettes, and chewing tobacco. If you need help quitting, ask your health care provider.  Keep all follow-up visits. This is important. This information is not intended to replace advice given to you by your health care provider. Make sure you discuss any questions you have with your health care provider. Document Revised: 12/23/2019 Document Reviewed: 10/29/2019 Elsevier Patient Education  2021 Elsevier  Inc.  

## 2020-12-19 NOTE — Addendum Note (Signed)
Addended by: Gae Dry on: 12/19/2020 04:52 PM   Modules accepted: Orders

## 2020-12-19 NOTE — Addendum Note (Signed)
Addended by: Quintella Baton D on: 12/19/2020 10:06 AM   Modules accepted: Orders

## 2020-12-20 LAB — BETA HCG QUANT (REF LAB): hCG Quant: 35169 m[IU]/mL

## 2020-12-20 NOTE — Progress Notes (Signed)
Cancel lab appt 5/25.  Keep appt 5/31 w PH and for [place] w me.  Pt aware.  Discussed with pt lab results and plan next for [place] to identify location and viability of fetus.  Pt denies pain or bleeding.  Pt has h/o ectopic.

## 2020-12-21 ENCOUNTER — Other Ambulatory Visit: Payer: Self-pay

## 2020-12-21 LAB — CERVICOVAGINAL ANCILLARY ONLY
Bacterial Vaginitis (gardnerella): POSITIVE — AB
Candida Glabrata: NEGATIVE
Candida Vaginitis: NEGATIVE
Chlamydia: NEGATIVE
Comment: NEGATIVE
Comment: NEGATIVE
Comment: NEGATIVE
Comment: NEGATIVE
Comment: NEGATIVE
Comment: NORMAL
Neisseria Gonorrhea: NEGATIVE
Trichomonas: NEGATIVE

## 2020-12-22 LAB — URINE CULTURE: Organism ID, Bacteria: NO GROWTH

## 2020-12-27 ENCOUNTER — Encounter: Payer: Self-pay | Admitting: Obstetrics & Gynecology

## 2020-12-27 ENCOUNTER — Ambulatory Visit (INDEPENDENT_AMBULATORY_CARE_PROVIDER_SITE_OTHER): Payer: Self-pay | Admitting: Obstetrics & Gynecology

## 2020-12-27 ENCOUNTER — Other Ambulatory Visit: Payer: Self-pay

## 2020-12-27 VITALS — BP 110/60 | Wt 144.0 lb

## 2020-12-27 DIAGNOSIS — Z3491 Encounter for supervision of normal pregnancy, unspecified, first trimester: Secondary | ICD-10-CM

## 2020-12-27 DIAGNOSIS — Z3A01 Less than 8 weeks gestation of pregnancy: Secondary | ICD-10-CM

## 2020-12-27 DIAGNOSIS — Z8759 Personal history of other complications of pregnancy, childbirth and the puerperium: Secondary | ICD-10-CM

## 2020-12-27 DIAGNOSIS — N926 Irregular menstruation, unspecified: Secondary | ICD-10-CM

## 2020-12-27 MED ORDER — ONDANSETRON 4 MG PO TBDP: 4.0000 mg | ORAL_TABLET | Freq: Four times a day (QID) | ORAL | 0 refills | Status: DC | PRN

## 2020-12-27 NOTE — Progress Notes (Signed)
  Subjective  Nausea daily, tol some liquids and foods No pain or bleeding  Objective  BP 110/60   Wt 144 lb (65.3 kg)   LMP 11/04/2020   BMI 23.96 kg/m  General: NAD Pumonary: no increased work of breathing Abdomen: gravid, non-tender Extremities: no edema Psychiatric: mood appropriate, affect full  Assessment  30 y.o. G2P0010 at [redacted]w[redacted]d by  08/11/2021, by Last Menstrual Period presenting for routine prenatal visit  Plan   Problem List Items Addressed This Visit      Other   History of ectopic pregnancy   Supervision of low-risk pregnancy, first trimester - Primary    Other Visit Diagnoses    Missed period       [redacted] weeks gestation of pregnancy        See [place] report from today PNV Zofran prn Labs and NIPT nv  pregnancy2 Problems (from 11/04/20 to present)    Problem Noted Resolved   History of ectopic pregnancy 12/19/2020 by Gae Dry, MD No   Supervision of low-risk pregnancy, first trimester 12/19/2020 by Gae Dry, MD No   Overview Addendum 12/27/2020 11:26 AM by Gae Dry, MD    [facility]inic [facility] Prenatal Labs  Dating LMP, [place] confirmed 7 wks Blood type: --/--/A POS (08/12 1543)   Genetic Screen  NIPS: Antibody:NEG (08/12 1543)  Anatomic [place]  Rubella:   Varicella: @VZVIGG @  GTT  Third trimester:  RPR:     Rhogam n/a HBsAg:     TDaP vaccine                  Flu Shot: HIV:     Baby Food                                GBS:   Contraception  Pap: 03/2020  CBB  no   CS/VBAC n/a Tob: quit at 5 weeks  [place], and Mother          Barnett Applebaum, MD, [facility], [facility] 12/27/2020  11:26 AM

## 2020-12-27 NOTE — Patient Instructions (Signed)
Thank you for choosing [facility]. As part of our ongoing efforts to improve patient experience, we would appreciate your feedback. Please fill out the short survey that you will receive by mail or MyChart. Your opinion is important to [place]! -Dr Kenton Kingfisher  Commonly Asked Questions During Pregnancy  Cats: A parasite can be excreted in cat feces.  To avoid exposure you need to have another person empty the little box.  If you must empty the litter box you will need to wear gloves.  Wash your hands after handling your cat.  This parasite can also be found in raw or undercooked meat so this should also be avoided.  Colds, Sore Throats, Flu: Please check your medication sheet to see what you can take for symptoms.  If your symptoms are unrelieved by these medications please call the office.  Dental Work: Most any dental work Investment banker, corporate recommends is permitted.  X-rays should only be taken during the first trimester if absolutely necessary.  Your abdomen should be shielded with a lead apron during all x-rays.  Please notify your provider prior to receiving any x-rays.  Novocaine is fine; gas is not recommended.  If your dentist requires a note from [place] prior to dental work please call the office and we will provide one for you.  Exercise: Exercise is an important part of staying healthy during your pregnancy.  You may continue most exercises you were accustomed to prior to pregnancy.  Later in your pregnancy you will most likely notice you have difficulty with activities requiring balance like riding a bicycle.  It is important that you listen to your body and avoid activities that put you at a higher risk of falling.  Adequate rest and staying well hydrated are a must!  If you have questions about the safety of specific activities ask your provider.    Exposure to Children with illness: Try to avoid obvious exposure; report any symptoms to [place] when noted,  If you have chicken pos, red measles or mumps, you  should be immune to these diseases.   Please do not take any vaccines while pregnant unless you have checked with your OB provider.  Fetal Movement: After 28 weeks we recommend you do "kick counts" twice daily.  Lie or sit down in a calm quiet environment and count your baby movements "kicks".  You should feel your baby at least 10 times per hour.  If you have not felt 10 kicks within the first hour get up, walk around and have something sweet to eat or drink then repeat for an additional hour.  If count remains less than 10 per hour notify your provider.  Fumigating: Follow your pest control agent's advice as to how long to stay out of your home.  Ventilate the area well before re-entering.  Hemorrhoids:   Most over-the-counter preparations can be used during pregnancy.  Check your medication to see what is safe to use.  It is important to use a stool softener or fiber in your diet and to drink lots of liquids.  If hemorrhoids seem to be getting worse please call the office.   Hot Tubs:  Hot tubs Jacuzzis and saunas are not recommended while pregnant.  These increase your internal body temperature and should be avoided.  Intercourse:  Sexual intercourse is safe during pregnancy as long as you are comfortable, unless otherwise advised by your provider.  Spotting may occur after intercourse; report any bright red bleeding that is heavier than spotting.  Labor:  If you know that you are in labor, please go to the hospital.  If you are unsure, please call the office and let us help you decide what to do.  Lifting, straining, etc:  If your job requires heavy lifting or straining please check with your provider for any limitations.  Generally, you should not lift items heavier than that you can lift simply with your hands and arms (no back muscles)  Painting:  Paint fumes do not harm your pregnancy, but may make you ill and should be avoided if possible.  Latex or water based paints have less odor than  oils.  Use adequate ventilation while painting.  Permanents & Hair Color:  Chemicals in hair dyes are not recommended as they cause increase hair dryness which can increase hair loss during pregnancy.  " Highlighting" and permanents are allowed.  Dye may be absorbed differently and permanents may not hold as well during pregnancy.  Sunbathing:  Use a sunscreen, as skin burns easily during pregnancy.  Drink plenty of fluids; avoid over heating.  Tanning Beds:  Because their possible side effects are still unknown, tanning beds are not recommended.  Ultrasound Scans:  Routine ultrasounds are performed at approximately 20 weeks.  You will be able to see your baby's general anatomy an if you would like to know the gender this can usually be determined as well.  If it is questionable when you conceived you may also receive an ultrasound early in your pregnancy for dating purposes.  Otherwise ultrasound exams are not routinely performed unless there is a medical necessity.  Although you can request a scan we ask that you pay for it when conducted because insurance does not cover " patient request" scans.  Work: If your pregnancy proceeds without complications you may work until your due date, unless your physician or employer advises otherwise.  Round Ligament Pain/Pelvic Discomfort:  Sharp, shooting pains not associated with bleeding are fairly common, usually occurring in the second trimester of pregnancy.  They tend to be worse when standing up or when you remain standing for long periods of time.  These are the result of pressure of certain pelvic ligaments called "round ligaments".  Rest, Tylenol and heat seem to be the most effective relief.  As the womb and fetus grow, they rise out of the pelvis and the discomfort improves.  Please notify the office if your pain seems different than that described.  It may represent a more serious condition.

## 2020-12-27 NOTE — Progress Notes (Signed)
ULTRASOUND REPORT  Location: [facility] Date of Service: 12/27/2020   Indications:dating Findings:  Emily Levy intrauterine pregnancy is visualized with a CRL consistent with [redacted]w[redacted]d gestation, giving an (U/S) EDD of 08/15/21. The (U/S) EDD is consistent with the clinically established EDD of 08/11/2021.  FHR: 150 BPM CRL measurement: 9.4 mm Yolk sac is visualized and appears normal. Amnion: visualized and appears normal   Right Ovary is normal in appearance. Left Ovary is normal appearance. Corpus luteal cyst:  is not visualized Survey of the adnexa demonstrates no adnexal masses. There is no free peritoneal fluid in the cul de sac.  Impression: 1. [redacted]w[redacted]d Viable Singleton Intrauterine pregnancy by U/S. 2. (U/S) EDD is consistent with Clinically established EDD of 08/11/21.  Recommendations: 1.Clinical correlation with the patient's History and Physical Exam. 2. Keep EDC 3.  Reassured due to her prior h/o ectopic  Hoyt Koch, MD

## 2020-12-28 IMAGING — US US OB < 14 WEEKS - US OB TV
1 of 2 series · 13 of 28 positions shown · non-contrast
Comparison: None.

CLINICAL DATA: Pregnant patient in first-trimester pregnancy with
vaginal bleeding. Beta HCG [DATE]. gestational age based on last
menstrual period 6 weeks 4 days.

EXAM:
OBSTETRIC <14 WK US AND TRANSVAGINAL OB US
TECHNIQUE: Both transabdominal and transvaginal ultrasound examinations were
performed for complete evaluation of the gestation as well as the
maternal uterus, adnexal regions, and pelvic cul-de-sac.
Transvaginal technique was performed to assess early pregnancy.

[Series 1: us ob less than 14 weeks with ob transvaginal · 157 acquisitions, 13 frames shown]
[im 7/157]
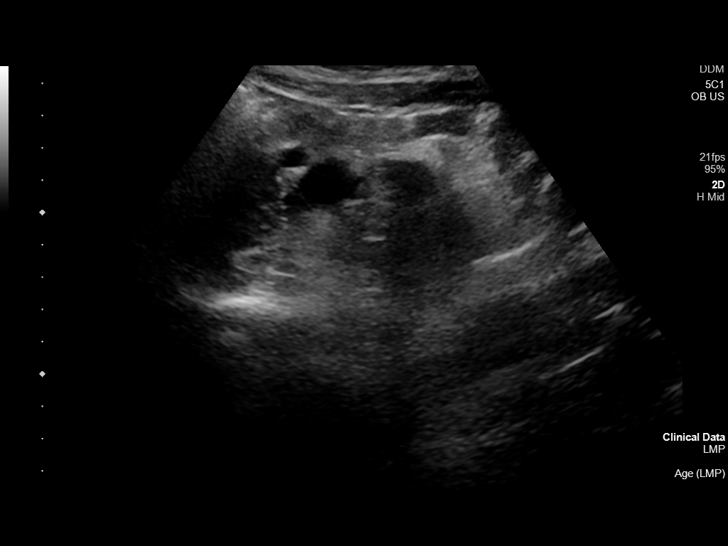
[im 19/157]
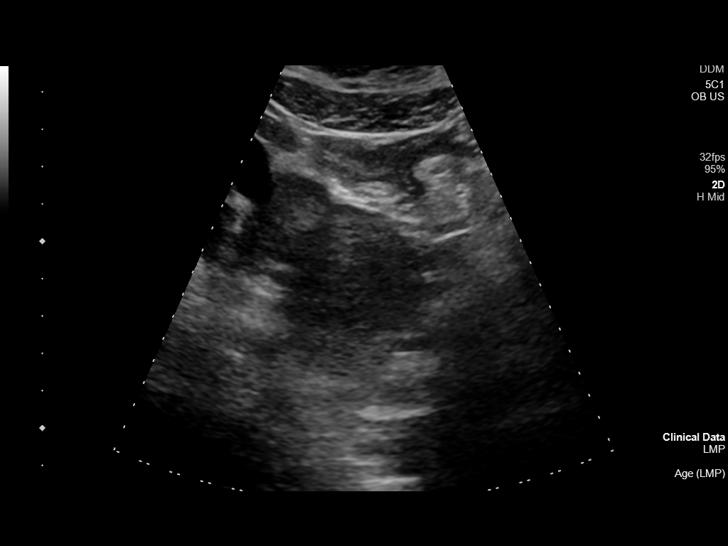
[im 31/157]
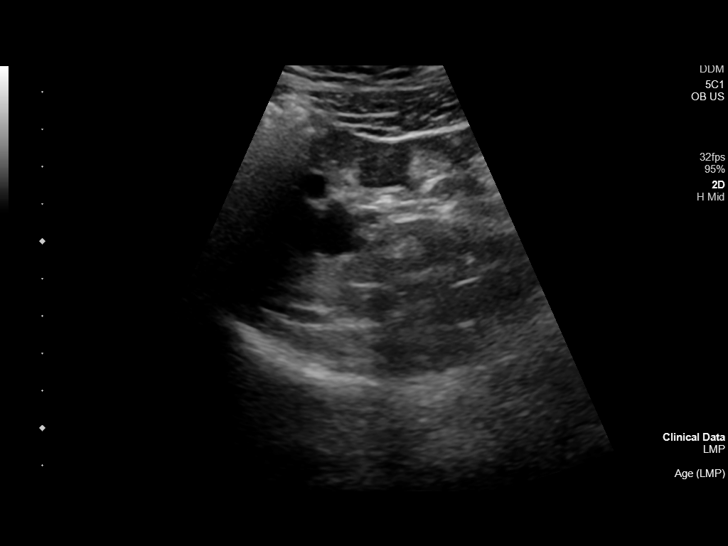
[im 43/157]
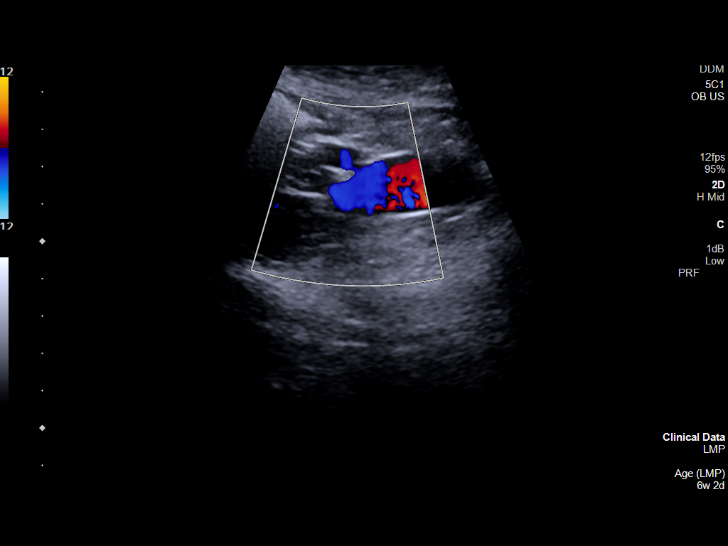
[im 55/157]
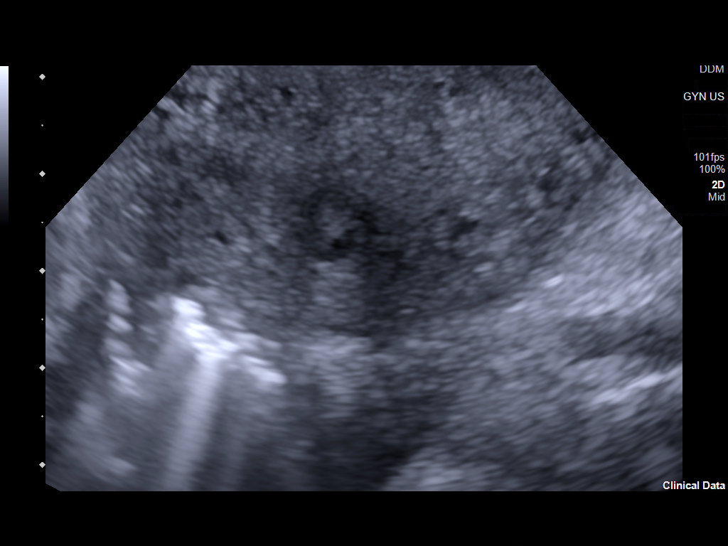
[im 67/157]
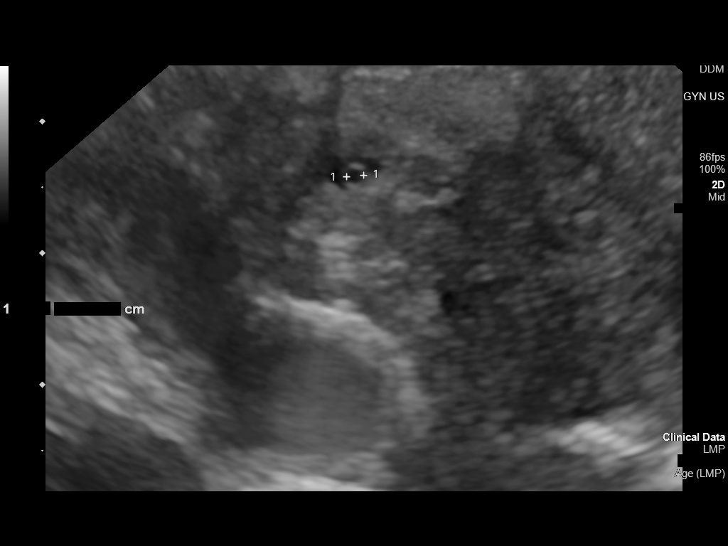
[im 85/157]
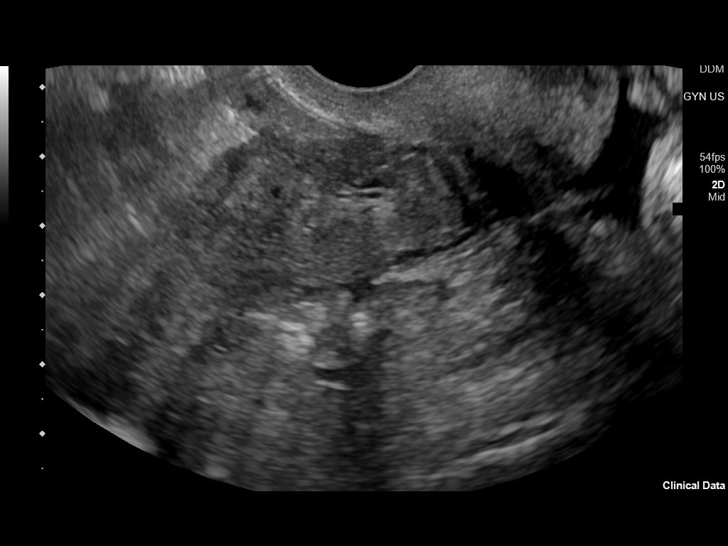
[im 97/157]
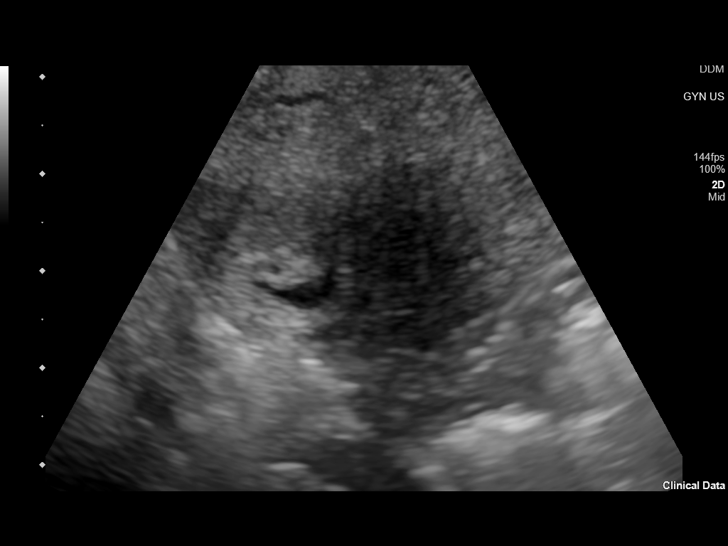
[im 109/157]
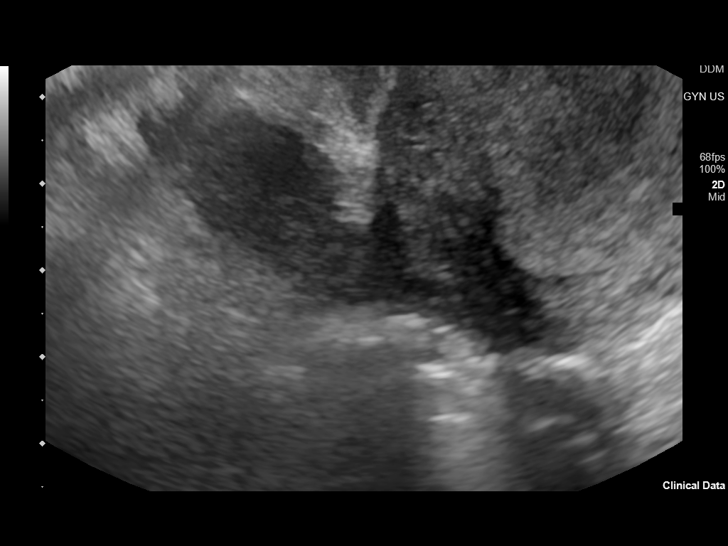
[im 121/157]
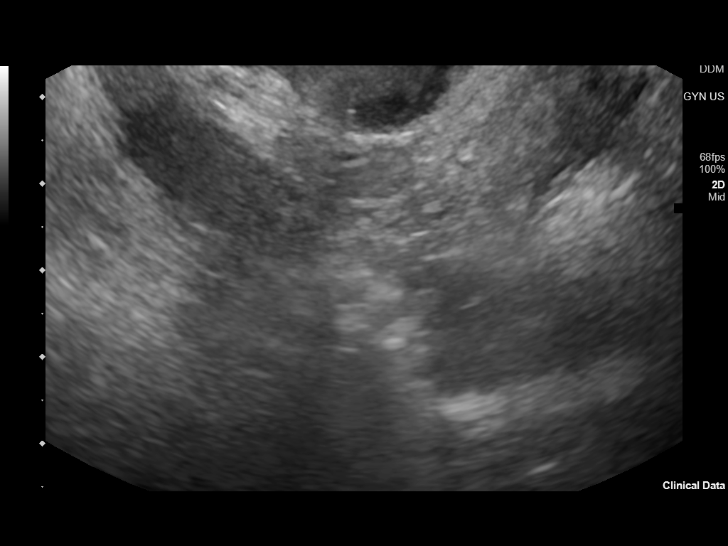
[im 133/157]
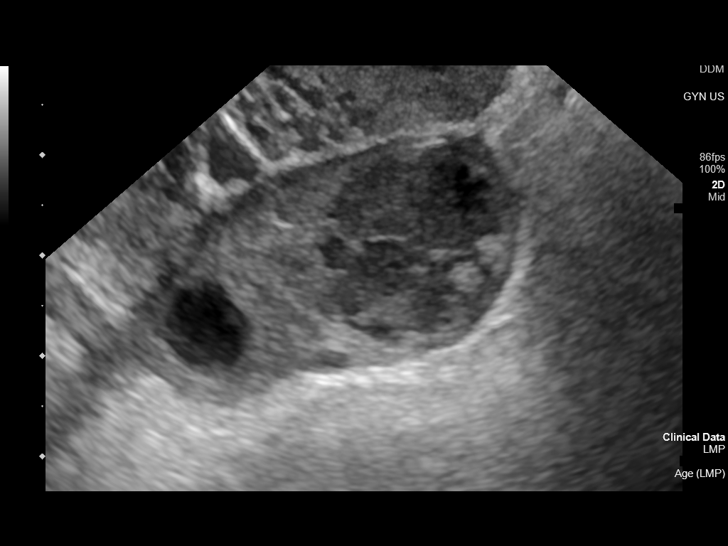
[im 145/157]
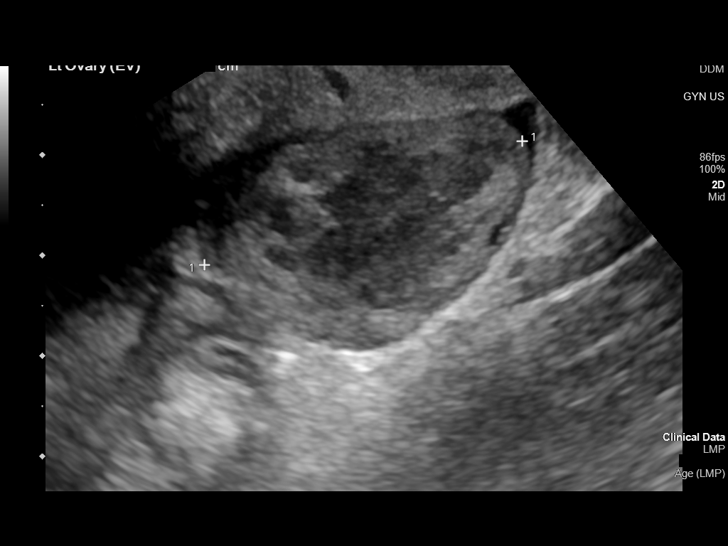
[im 157/157]
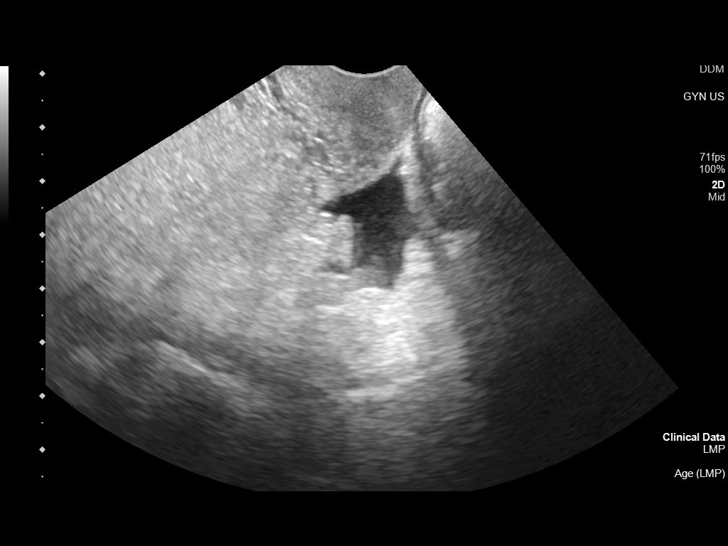

[13 of 28 positions shown; findings below may reference images not displayed]

FINDINGS: Intrauterine gestational sac: No well-defined intrauterine
gestational sac. There is small amount of ill-defined fluid in the
endometrial canal.

Right adnexal ectopic:

Yolk sac:  Present

Embryo:  Questionable.

Cardiac Activity: Not visualized.

MSD of ectopic: 4.3 mm   5 w   1 d

Maternal uterus/adnexae: There is a small amount of ill-defined
fluid in the endometrial canal but no definitive intrauterine
gestational sac. The endometrium is thickened. Within the right
adnexa adjacent to the right ovary is a 1.2 x 0.8 cm adnexal
echogenic lesion containing a gestational sac and yolk sac most
consistent with ectopic. Potential fetal pole but no cardiac
activity within the adnexal. This appears adjacent to the right
ovary which is normal. The left ovary measures 4.3 x 2.1 x 3.4 cm
and contains a probable corpus luteal cyst. There is trace free
fluid in the pelvis that appears simple.
IMPRESSION: 1. Findings consistent with right adnexal ectopic pregnancy
measuring 1.2 x 0.8 cm containing a gestational sac and yolk sac and
questionable fetal pole but no cardiac activity. No evidence of
rupture.
2. Small amount of ill-defined fluid in the endometrial canal but no
intrauterine gestational sac.

Critical Value/emergent results were called by telephone at the time
of interpretation on 02/24/2020 at [DATE] to Ebadat Tiger, who
verbally acknowledged these results.

## 2021-01-19 ENCOUNTER — Encounter: Payer: Self-pay | Admitting: Obstetrics and Gynecology

## 2021-01-19 ENCOUNTER — Ambulatory Visit (INDEPENDENT_AMBULATORY_CARE_PROVIDER_SITE_OTHER): Payer: Medicaid Other | Admitting: Obstetrics and Gynecology

## 2021-01-19 ENCOUNTER — Other Ambulatory Visit: Payer: Self-pay

## 2021-01-19 VITALS — BP 112/70 | Ht 65.0 in | Wt 147.4 lb

## 2021-01-19 DIAGNOSIS — Z1379 Encounter for other screening for genetic and chromosomal anomalies: Secondary | ICD-10-CM

## 2021-01-19 DIAGNOSIS — Z3A10 10 weeks gestation of pregnancy: Secondary | ICD-10-CM

## 2021-01-19 DIAGNOSIS — Z3491 Encounter for supervision of normal pregnancy, unspecified, first trimester: Secondary | ICD-10-CM

## 2021-01-19 DIAGNOSIS — O210 Mild hyperemesis gravidarum: Secondary | ICD-10-CM

## 2021-01-19 LAB — POCT URINALYSIS DIPSTICK OB
Glucose, UA: NEGATIVE
POC,PROTEIN,UA: NEGATIVE

## 2021-01-19 MED ORDER — DOCUSATE SODIUM 100 MG PO CAPS: 100.0000 mg | ORAL_CAPSULE | Freq: Two times a day (BID) | ORAL | 3 refills | Status: AC | PRN

## 2021-01-19 MED ORDER — ONDANSETRON 4 MG PO TBDP: 4.0000 mg | ORAL_TABLET | Freq: Four times a day (QID) | ORAL | 3 refills | Status: AC | PRN

## 2021-01-19 MED ORDER — PROMETHAZINE HCL 25 MG PO TABS: 25.0000 mg | ORAL_TABLET | Freq: Four times a day (QID) | ORAL | 3 refills | Status: DC | PRN

## 2021-01-19 NOTE — Progress Notes (Signed)
Routine Prenatal Care Visit  Subjective  Emily Levy is a 30 y.o. G2P0010 at [redacted]w[redacted]d being seen today for ongoing prenatal care.  She is currently monitored for the following issues for this low-risk pregnancy and has Sinus tachycardia; Restless leg syndrome; Ectopic pregnancy of right ovary; Ovarian cyst, left; Pelvic pain; History of ectopic pregnancy; and Supervision of low-risk pregnancy, first trimester on their problem list.  ----------------------------------------------------------------------------------- Patient reports nausea and vomiting. She vomits about 2 times a day. She had not lost weight. She is taking zofran 1-2 times a day. She has been feeling constipated as well.   Contractions: Not present. Vag. Bleeding: None.  Movement: Absent. Denies leaking of fluid.  ----------------------------------------------------------------------------------- The following portions of the patient's history were reviewed and updated as appropriate: allergies, current medications, past family history, past medical history, past social history, past surgical history and problem list. Problem list updated.   Objective  Blood pressure 112/70, height 5\' 5"  (1.651 m), weight 147 lb 6.4 oz (66.9 kg), last menstrual period 11/04/2020, unknown if currently breastfeeding. Pregravid weight 136 lb (61.7 kg) Total Weight Gain 11 lb 6.4 oz (5.171 kg) Urinalysis:      Fetal Status: Fetal Heart Rate (bpm): 160   Movement: Absent     General:  Alert, oriented and cooperative. Patient is in no acute distress.  Skin: Skin is warm and dry. No rash noted.   Cardiovascular: Normal heart rate noted  Respiratory: Normal respiratory effort, no problems with respiration noted  Abdomen: Soft, gravid, appropriate for gestational age. Pain/Pressure: Absent     Pelvic:  Cervical exam deferred        Extremities: Normal range of motion.  Edema: None  Mental Status: Normal mood and affect. Normal behavior.  Normal judgment and thought content.     Assessment   30 y.o. G2P0010 at [redacted]w[redacted]d by  08/11/2021, by Last Menstrual Period presenting for routine prenatal visit  Plan   pregnancy2 Problems (from 11/04/20 to present)     Problem Noted Resolved   History of ectopic pregnancy 12/19/2020 by Gae Dry, MD No   Supervision of low-risk pregnancy, first trimester 12/19/2020 by Gae Dry, MD No   Overview Addendum 01/19/2021  2:55 PM by Homero Fellers, MD     Nursing Staff Provider  Office Location  [facility] Dating   LMP = 7wk [place]  Language  English Anatomy US    Flu Vaccine   Genetic Screen  NIPS:   TDaP vaccine    Hgb A1C or  GTT Early : Third trimester :   Covid    LAB RESULTS   Rhogam  Not needed Blood Type --/--/A POS (08/12 1543)   Feeding Plan  Antibody NEG (08/12 1543)  Contraception  Rubella    Circumcision  RPR     Pediatrician   HBsAg     Support Person  Richardson Landry and mtoher HIV    Prenatal Classes  Varicella     GBS  (For PCN allergy, check sensitivities)   BTL Consent     VBAC Consent  Pap  2021 NIL    Hgb Electro      CF      SMA         Hx tobacco use in pregnancy- quit early in pregnancy           Encouraged covid vaccination. Labs today. Discussed strategies for hyperemesis management and constipation. Rx sent.   Gestational age appropriate obstetric precautions including but  not limited to vaginal bleeding, contractions, leaking of fluid and fetal movement were reviewed in detail with the patient.    Return in about 2 weeks (around 02/02/2021) for ROB in person.  Homero Fellers MD [facility], [facility] 01/19/2021, 2:58 PM

## 2021-01-19 NOTE — Patient Instructions (Addendum)
Initial steps to help :  B6 (pyridoxine) 25 mg,  3-4 times a day- 200 mg a day total Unisom (doxylamine) 25 mg at bedtime **B6 and Unisom are available as a combination prescription medications called diclegis and bonjesta  B1 (thiamin)  50-100 mg 1-2 a day-  100 mg a day total  Continue prenatal vitamin with iron and thiamin. If it is not tolerated switch to 1 mg of folic acid.  Can add medication for gastric reflux if needed.  Subsequent steps to be added to B1, B6, and Unisom:  Antihistamine (one of the following medications) Dramamine      25-50 mg every 4-6 hours Benadryl      25-50 mg every 4-6 hours Meclizine      25 mg every 6 hours  2. Dopamine Antagonist (one of the following medications) Metoclopramide  (Reglan)  5-10 mg every 6-8 hours         PO Promethazine   (Phenergan)   12.5-25 mg every 4-6 hours      PO or rectal Prochlorperazine  (Compazine)  5-10 mg every 6-8 hours     24m BID rectally   Subsequent steps if there has still not been improvement in symptoms:  3. Daily stool softner:  Colace 100 mg twice a day 4. Ondansetron  (Zofran)   4-8 mg every 6-8 hours   Hyperemesis Gravidarum Hyperemesis gravidarum is a severe form of nausea and vomiting that happens during pregnancy. Hyperemesis is worse than morning sickness. It may cause you to have nausea or vomiting all day for many days. It may keep you from eating and drinking enough food and liquids, which can lead to dehydration, malnutrition, and weight loss. Hyperemesis usually occurs during the first half (the first 20 weeks) of pregnancy. It often goes away once a woman is in her second half of pregnancy. However, sometimes hyperemesis continues through anentire pregnancy. What are the causes? The cause of this condition is not known. It may be associated with: Changes in hormones in the body during pregnancy. Changes in the gastrointestinal system. Genetic or inherited conditions. What are the signs or  symptoms? Symptoms of this condition include: Severe nausea and vomiting that does not go away. Problems keeping food down. Weight loss. Loss of body fluid (dehydration). Loss of appetite. You may have no desire to eat or you may not like the food you have previously enjoyed. How is this diagnosed? This condition may be diagnosed based on your medical history, your symptoms,and a physical exam. You may also have other tests, including: Blood tests. Urine tests. Blood pressure tests. Ultrasound to look for problems with the placenta or to check if you are pregnant with more than one baby. How is this treated? This condition is managed by controlling symptoms. This may include: Following an eating plan. This can help to lessen nausea and vomiting. Treatments that do not use medicine. These include acupressure bracelets, hypnosis, and eating or drinking foods or fluids that contain ginger, ginger ale, or ginger tea. Taking prescription medicine or over-the-counter medicine as told by your health care provider. Continuing to take prenatal vitamins. You may need to change what kind you take and when you take them. Follow your health care provider's instructions about prenatal vitamins. An eating plan and medicines are often used together to help control symptoms. If medicines do not help relieve nausea and vomiting, you may need to receivefluids through an IV at the hospital. Follow these instructions at home: To help relieve  your symptoms, listen to your body. Everyone is different and has different preferences. Find what works best for you. Here are some thingsyou can try to help relieve your symptoms: Meals and snacks  Eat 5-6 small meals daily instead of 3 large meals. Eating small meals and snacks can help you avoid an empty stomach. Before getting out of bed, eat a couple of crackers to avoid moving around on an empty stomach. Eat a protein-rich snack before bed. Examples include cheese  and crackers, or a peanut butter sandwich made with 1 slice of whole-wheat bread and 1 tsp (5 g) of peanut butter. Eat and drink slowly. Try eating starchy foods as these are usually tolerated well. Examples include cereal, toast, bread, potatoes, pasta, rice, and pretzels. Eat at least one serving of protein with your meals and snacks. Protein options include lean meats, poultry, seafood, beans, nuts, nut butters, eggs, cheese, and yogurt. Eat or suck on things that have ginger in them. It may help to relieve nausea. Add  tsp (0.44 g) ground ginger to hot tea, or choose ginger tea.  Fluids It is important to stay hydrated. Try to: Drink small amounts of fluids often. Drink fluids 30 minutes before or after a meal to help lessen the feeling of a full stomach. Drink 100% fruit juice or an electrolyte drink. An electrolyte drink contains sodium, potassium, and chloride. Drink fluids that are cold, clear, and carbonated or sour. These include lemonade, ginger ale, lemon-lime soda, ice water, and sparkling water. Things to avoid Avoid the following: Eating foods that trigger your symptoms. These may include spicy foods, coffee, high-fat foods, very sweet foods, and acidic foods. Drinking more than 1 cup of fluid at a time. Skipping meals. Nausea can be more intense on an empty stomach. If you cannot tolerate food, do not force it. Try sucking on ice chips or other frozen items and make up for missed calories later. Lying down within 2 hours after eating. Being exposed to environmental triggers. These may include food smells, smoky rooms, closed spaces, rooms with strong smells, warm or humid places, overly loud and noisy rooms, and rooms with motion or flickering lights. Try eating meals in a well-ventilated area that is free of strong smells. Making quick and sudden changes in your movement. Taking iron pills and multivitamins that contain iron. If you take prescription iron pills, do not stop  taking them unless your health care provider approves. Preparing food. The smell of food can spoil your appetite or trigger nausea. General instructions Brush your teeth or use a mouth rinse after meals. Take over-the-counter and prescription medicines only as told by your health care provider. Follow instructions from your health care provider about eating or drinking restrictions. Talk with your health care provider about starting a supplement of vitamin B6. Continue to take your prenatal vitamins as told by your health care provider. If you are having trouble taking your prenatal vitamins, talk with your health care provider about other options. Keep all follow-up visits. This is important. Follow-up visits include prenatal visits. Contact a health care provider if: You have pain in your abdomen. You have a severe headache. You have vision problems. You are losing weight. You feel weak or dizzy. You cannot eat or drink without vomiting, especially if this goes on for a full day. Get help right away if: You cannot drink fluids without vomiting. You vomit blood. You have constant nausea and vomiting. You are very weak. You faint. You have a  fever and your symptoms suddenly get worse. Summary Hyperemesis gravidarum is a severe form of nausea and vomiting that happens during pregnancy. Making some changes to your eating habits may help relieve nausea and vomiting. This condition may be managed with lifestyle changes and medicines as prescribed by your health care provider. If medicines do not help relieve nausea and vomiting, you may need to receive fluids through an IV at the hospital. This information is not intended to replace advice given to you by your health care provider. Make sure you discuss any questions you have with your healthcare provider. Document Revised: 02/08/2020 Document Reviewed: 02/08/2020 Elsevier Patient Education  2022 [place] of  Pregnancy  The first trimester of pregnancy starts on the first day of your last menstrual period until the end of week 12. This is months 1 through 3 of pregnancy. A week after a sperm fertilizes an egg, the egg will implant into the wall of the uterus and begin to develop into a baby. By the end of 12 weeks, all the baby'sorgans will be formed and the baby will be 2-3 inches in size. Body changes during your first trimester Your body goes through many changes during pregnancy. The changes vary andgenerally return to normal after your baby is born. Physical changes You may gain or lose weight. Your breasts may begin to grow larger and become tender. The tissue that surrounds your nipples (areola) may become darker. Dark spots or blotches (chloasma or mask of pregnancy) may develop on your face. You may have changes in your hair. These can include thickening or thinning of your hair or changes in texture. Health changes You may feel nauseous, and you may vomit. You may have heartburn. You may develop headaches. You may develop constipation. Your gums may bleed and may be sensitive to brushing and flossing. Other changes You may tire easily. You may urinate more often. Your menstrual periods will stop. You may have a loss of appetite. You may develop cravings for certain kinds of food. You may have changes in your emotions from day to day. You may have more vivid and strange dreams. Follow these instructions at home: Medicines Follow your health care provider's instructions regarding medicine use. Specific medicines may be either safe or unsafe to take during pregnancy. Do not take any medicines unless told to by your health care provider. Take a prenatal vitamin that contains at least 600 micrograms (mcg) of folic acid. Eating and drinking Eat a healthy diet that includes fresh fruits and vegetables, whole grains, good sources of protein such as meat, eggs, or tofu, and low-fat dairy  products. Avoid raw meat and unpasteurized juice, milk, and cheese. These carry germs that can harm you and your baby. If you feel nauseous or you vomit: Eat 4 or 5 small meals a day instead of 3 large meals. Try eating a few soda crackers. Drink liquids between meals instead of during meals. You may need to take these actions to prevent or treat constipation: Drink enough fluid to keep your urine pale yellow. Eat foods that are high in fiber, such as beans, whole grains, and fresh fruits and vegetables. Limit foods that are high in fat and processed sugars, such as fried or sweet foods. Activity Exercise only as directed by your health care provider. Most people can continue their usual exercise routine during pregnancy. Try to exercise for 30 minutes at least 5 days a week. Stop exercising if you develop pain or  cramping in the lower abdomen or lower back. Avoid exercising if it is very hot or humid or if you are at high altitude. Avoid heavy lifting. If you choose to, you may have sex unless your health care provider tells you not to. Relieving pain and discomfort Wear a good support bra to relieve breast tenderness. Rest with your legs elevated if you have leg cramps or low back pain. If you develop bulging veins (varicose veins) in your legs: Wear support hose as told by your health care provider. Elevate your feet for 15 minutes, 3-4 times a day. Limit salt in your diet. Safety Wear your seat belt at all times when driving or riding in a car. Talk with your health care provider if someone is verbally or physically abusive to you. Talk with your health care provider if you are feeling sad or have thoughts of hurting yourself. Lifestyle Do not use hot tubs, steam rooms, or saunas. Do not douche. Do not use tampons or scented sanitary pads. Do not use herbal remedies, alcohol, illegal drugs, or medicines that are not approved by your health care provider. Chemicals in these products  can harm your baby. Do not use any products that contain nicotine or tobacco, such as cigarettes, e-cigarettes, and chewing tobacco. If you need help quitting, ask your health care provider. Avoid cat litter boxes and soil used by cats. These carry germs that can cause birth defects in the baby and possibly loss of the unborn baby (fetus) by miscarriage or stillbirth. General instructions During routine prenatal visits in the first trimester, your health care provider will do a physical exam, perform necessary tests, and ask you how things are going. Keep all follow-up visits. This is important. Ask for help if you have counseling or nutritional needs during pregnancy. Your health care provider can offer advice or refer you to specialists for help with various needs. Schedule a dentist appointment. At home, brush your teeth with a soft toothbrush. Floss gently. Write down your questions. Take them to your prenatal visits. Where to find more information American Pregnancy Association: americanpregnancy.[place] and Gynecologists: PoolDevices.com.pt Office on [employer]: KeywordPortfolios.com.br Contact a health care provider if you have: Dizziness. A fever. Mild pelvic cramps, pelvic pressure, or nagging pain in the abdominal area. Nausea, vomiting, or diarrhea that lasts for 24 hours or longer. A bad-smelling vaginal discharge. Pain when you urinate. Known exposure to a contagious illness, such as chickenpox, measles, Zika virus, HIV, or hepatitis. Get help right away if you have: Spotting or bleeding from your vagina. Severe abdominal cramping or pain. Shortness of breath or chest pain. Any kind of trauma, such as from a fall or a car crash. New or increased pain, swelling, or redness in an arm or leg. Summary The first trimester of pregnancy starts on the first day of your last menstrual period until the end of week 12 (months 1  through 3). Eating 4 or 5 small meals a day rather than 3 large meals may help to relieve nausea and vomiting. Do not use any products that contain nicotine or tobacco, such as cigarettes, e-cigarettes, and chewing tobacco. If you need help quitting, ask your health care provider. Keep all follow-up visits. This is important. This information is not intended to replace advice given to you by your health care provider. Make sure you discuss any questions you have with your healthcare provider. Document Revised: 12/23/2019 Document Reviewed: 10/29/2019 Elsevier Patient Education  2022 [employer].  Pregnancy and Vaccinations Vaccines can help to keep you healthy. There are some vaccines that should begiven before pregnancy and some that should be given during pregnancy. How does this affect me? If you are pregnant or thinking about getting pregnant, talk with your healthcare provider about what vaccines are right for you. How does this affect my baby? Usually, the benefits of receiving vaccines during pregnancy outweigh the risks of harm to you or your baby if: The risk of being exposed to a disease is high. Infection would pose a risk to you or your unborn baby. The vaccine is not likely to cause harm. Vaccines can help protect your baby from some diseases until he or she is oldenough to get the vaccine. What can I do to lower my risk? When you receive the recommended vaccines, it helps to protect you from gettingcertain diseases and passing them on to your baby. Should I receive vaccines before pregnancy? If possible, make sure that your vaccines are up to date before you become pregnant. It is safe and important for you to receive weakened viral and weakened bacterial vaccines (inactivated vaccines) as needed before you are pregnant. Live viral and live bacterial vaccines, such as the measles, mumps, and rubella (MMR) vaccine, should be given 1 month or more before pregnancy. Sometimes,  women become pregnant within 1 month of receiving a live vaccine that is not usually recommended during pregnancy. The U.S. Centers for Disease Control and Prevention (CDC) has reported that when this has happened, vaccineshave not harmed pregnant women or their unborn babies. Should I receive vaccines during pregnancy?  It is safe and important for you to receive some inactivated vaccines as needed during pregnancy. Until your baby can receive vaccines, your baby will get some protection from diseases through the vaccines that you receive while you arepregnant. During your pregnancy, you should receive the following: Influenza vaccine (the flu shot). The flu shot may protect you and your baby (up to 26 months of age) from some complications associated with strains of influenza that are covered by the vaccine. Pregnant women can receive the flu shot at any time during pregnancy. Tetanus, diphtheria, and pertussis (Tdap) vaccine. The Tdap vaccine will help to prevent whooping cough (pertussis) in you and your baby. You should receive 1 dose of this vaccine during each pregnancy. It is recommended that pregnant women receive this vaccine between 27 and 36 weeks of pregnancy. Should I receive vaccines after pregnancy? It is safe and important for you to receive vaccines as needed after pregnancy. Some are safe to have if you are breastfeeding. Other vaccines may not be safe to have until after you have stopped breastfeeding. If you did not receive the Tdap vaccine during your pregnancy and have never received a Tdap vaccine, you should receive that vaccine right after you give birth to your baby (delivery). If you are not immune to measles, mumps, rubella, or chickenpox (varicella), you should receive those vaccines within days after delivery. It is important to talk with your health care provider about what vaccines you may need after delivery. What if I am pregnant and I plan to travel  internationally? If you are pregnant and you are planning to travel internationally, talk with your health care provider at least 4-6 weeks before your trip. Depending on the country you are planning to visit, you may need to take special precautions orget certain vaccines to prevent disease. Vaccines that may be recommended for pregnant international travelers include: Influenza (the  flu shot). Tetanus and diphtheria (Td) or Tdap. Hepatitis B (HepB). Hepatitis A (HepA). Your health care provider can help you decide if you need vaccines and if thebenefits outweigh the risk of disease exposure. Follow these instructions at home: Take over-the-counter and prescription medicines as told by your health care provider. Keep all follow-up visits as told by your health care provider. This is important. Questions to ask your health care provider: What vaccines are safe during pregnancy? What are the risks of vaccines during pregnancy? What are the potential side effects of vaccines during or after pregnancy? When should I get vaccines during pregnancy? Contact a health care provider if you: Believe you have had a reaction to a vaccine. Have concerns or questions about a vaccine. Become pregnant within 1 month after you have received a live vaccine. Summary Vaccines are the most effective way to prevent certain diseases. Many vaccines are safe to receive during pregnancy. Some vaccines are recommended during pregnancy to protect you and your baby from getting sick. If you are pregnant or planning to become pregnant, talk with your health care provider about what vaccines are right for you. This information is not intended to replace advice given to you by your health care provider. Make sure you discuss any questions you have with your healthcare provider. Document Revised: 12/23/2019 Document Reviewed: 08/20/2018 Elsevier Patient Education  2022 [employer].

## 2021-01-20 LAB — RPR+RH+ABO+RUB AB+AB SCR+CB...
Antibody Screen: NEGATIVE
HIV Screen 4th Generation wRfx: NONREACTIVE
Hematocrit: 30.1 % — ABNORMAL LOW (ref 34.0–46.6)
Hemoglobin: 10.1 g/dL — ABNORMAL LOW (ref 11.1–15.9)
Hepatitis B Surface Ag: NEGATIVE
MCH: 28.9 pg (ref 26.6–33.0)
MCHC: 33.6 g/dL (ref 31.5–35.7)
MCV: 86 fL (ref 79–97)
Platelets: 252 10*3/uL (ref 150–450)
RBC: 3.49 x10E6/uL — ABNORMAL LOW (ref 3.77–5.28)
RDW: 14.1 % (ref 11.7–15.4)
RPR Ser Ql: NONREACTIVE
Rh Factor: POSITIVE
Rubella Antibodies, IGG: 5.51 index (ref >0.99)
Varicella zoster IgG: >4000 index (ref >165)
WBC: 11.6 10*3/uL — ABNORMAL HIGH (ref 3.4–10.8)

## 2021-01-20 LAB — HEMOGLOBIN A1C
Est. average glucose Bld gHb Est-mCnc: 103 mg/dL
Hgb A1c MFr Bld: 5.2 % (ref 4.8–5.6)

## 2021-01-20 LAB — HEPATITIS C ANTIBODY: Hep C Virus Ab: <0.1 s/co ratio (ref 0.0–0.9)

## 2021-01-24 LAB — MATERNIT21 PLUS CORE+SCA
Fetal Fraction: 8
Monosomy X (Turner Syndrome): NOT DETECTED
Result (T21): NEGATIVE
Trisomy 13 (Patau syndrome): NEGATIVE
Trisomy 18 (Edwards syndrome): NEGATIVE
Trisomy 21 (Down syndrome): NEGATIVE
XXX (Triple X Syndrome): NOT DETECTED
XXY (Klinefelter Syndrome): NOT DETECTED
XYY (Jacobs Syndrome): NOT DETECTED

## 2021-02-01 ENCOUNTER — Ambulatory Visit (INDEPENDENT_AMBULATORY_CARE_PROVIDER_SITE_OTHER): Payer: Medicaid Other | Admitting: Obstetrics and Gynecology

## 2021-02-01 ENCOUNTER — Other Ambulatory Visit: Payer: Self-pay

## 2021-02-01 ENCOUNTER — Encounter: Payer: Self-pay | Admitting: Obstetrics and Gynecology

## 2021-02-01 VITALS — BP 115/70 | Ht 65.0 in | Wt 147.0 lb

## 2021-02-01 DIAGNOSIS — Z3491 Encounter for supervision of normal pregnancy, unspecified, first trimester: Secondary | ICD-10-CM

## 2021-02-01 DIAGNOSIS — O99011 Anemia complicating pregnancy, first trimester: Secondary | ICD-10-CM

## 2021-02-01 DIAGNOSIS — Z3A12 12 weeks gestation of pregnancy: Secondary | ICD-10-CM

## 2021-02-01 LAB — POCT URINALYSIS DIPSTICK OB
Glucose, UA: NEGATIVE
POC,PROTEIN,UA: NEGATIVE

## 2021-02-01 NOTE — Patient Instructions (Signed)
Iron-Rich Diet  Iron is a mineral that helps your body produce hemoglobin. Hemoglobin is a protein in red blood cells that carries oxygen to your body's tissues. Eating too little iron may cause you to feel weak and tired, and it can increase your risk of infection. Iron is naturally found in many foods, and many foods have iron added to them (are iron-fortified). You may need to follow an iron-rich diet if you do not have enough iron in your body due to certain medical conditions. The amount of iron that you need each day depends on your age, your sex, and any medical conditions you have. Follow instructions from your health care provider or a dietitian about how much ironyou should eat each day. What are tips for following this plan? Reading food labels Check food labels to see how many milligrams (mg) of iron are in each serving. Cooking Cook foods in pots and pans that are made from iron. Take these steps to make it easier for your body to absorb iron from certain foods: Soak beans overnight before cooking. Soak whole grains overnight and drain them before using. Ferment flours before baking, such as by using yeast in bread dough. Meal planning When you eat foods that contain iron, you should eat them with foods that are high in vitamin C. These include oranges, peppers, tomatoes, potatoes, and mangoes. Vitamin C helps your body absorb iron. Certain foods and drinks prevent your body from absorbing iron properly. Avoid eating these foods in the same meal as iron-rich foods or with iron supplements. These foods include: Coffee, black tea, and red wine. Milk, dairy products, and foods that are high in calcium. Beans and soybeans. Whole grains. General information Take iron supplements only as told by your health care provider. An overdose of iron can be life-threatening. If you were prescribed iron supplements, take them with orange juice or a vitamin C supplement. When you eat iron-fortified  foods or take an iron supplement, you should also eat foods that naturally contain iron, such as meat, poultry, and fish. Eating naturally iron-rich foods helps your body absorb the iron that is added to other foods or contained in a supplement. Iron from animal sources is better absorbed than iron from plant sources. What foods should I eat? Fruits Prunes. Raisins. Eat fruits high in vitamin C, such as oranges, grapefruits, and strawberries,with iron-rich foods. Vegetables Spinach (cooked). Green peas. Broccoli. Fermented vegetables. Eat vegetables high in vitamin C, such as leafy greens, potatoes, bell peppers,and tomatoes, with iron-rich foods. Grains Iron-fortified breakfast cereal. Iron-fortified whole-wheat bread. Enrichedrice. Sprouted grains. Meats and other proteins Beef liver. Beef. [place]. Chicken. Oysters. Shrimp. [place]. Sardines. Chickpeas.Nuts. Tofu. Pumpkin seeds. Beverages Tomato juice. Fresh orange juice. Prune juice. Hibiscus tea. Iron-fortifiedinstant breakfast shakes. Sweets and desserts Blackstrap molasses. Seasonings and condiments Tahini. Fermented soy sauce. Other foods Wheat germ. The items listed above may not be a complete list of recommended foods and beverages. Contact a dietitian for more information. What foods should I limit? These are foods that should be limited while eating iron-rich foods as they canreduce the absorption of iron in your body. Grains Whole grains. Bran cereal. Bran flour. Meats and other proteins Soybeans. Products made from soy protein. Black beans. Lentils. Mung beans.Split peas. Dairy Milk. Cream. Cheese. Yogurt. Cottage cheese. Beverages Coffee. Black tea. Red wine. Sweets and desserts Cocoa. Chocolate. Ice cream. Seasonings and condiments Basil. Oregano. Large amounts of parsley. The items listed above may not be a complete list of  foods and beverages you should limit. Contact a dietitian for more information. Summary Iron  is a mineral that helps your body produce hemoglobin. Hemoglobin is a protein in red blood cells that carries oxygen to your body's tissues. Iron is naturally found in many foods, and many foods have iron added to them (are iron-fortified). When you eat foods that contain iron, you should eat them with foods that are high in vitamin C. Vitamin C helps your body absorb iron. Certain foods and drinks prevent your body from absorbing iron properly, such as whole grains and dairy products. You should avoid eating these foods in the same meal as iron-rich foods or with iron supplements. This information is not intended to replace advice given to you by your health care provider. Make sure you discuss any questions you have with your healthcare provider. Document Revised: 06/27/2020 Document Reviewed: 06/27/2020 Elsevier Patient Education  2022 [employer].

## 2021-02-01 NOTE — Progress Notes (Signed)
Routine Prenatal Care Visit  Subjective  Emily Levy is a 30 y.o. G2P0010 at [redacted]w[redacted]d being seen today for ongoing prenatal care.  She is currently monitored for the following issues for this low-risk pregnancy and has Sinus tachycardia; Restless leg syndrome; Ectopic pregnancy of right ovary; Ovarian cyst, left; Pelvic pain; History of ectopic pregnancy; and Supervision of low-risk pregnancy, first trimester on their problem list.  ----------------------------------------------------------------------------------- Patient reports  that she stopped her nausea medication because it was causing stomach upset.  She feels like she has more embrace the nausea vomiting.  She reports that she generally vomits about 3 times a day.  She feels like she is able to sometimes keep food down and staying hydrated.    Contractions: Not present. Vag. Bleeding: None.  Movement: Absent. Denies leaking of fluid.  ----------------------------------------------------------------------------------- The following portions of the patient's history were reviewed and updated as appropriate: allergies, current medications, past family history, past medical history, past social history, past surgical history and problem list. Problem list updated.   Objective  Blood pressure 115/70, height 5\' 5"  (1.651 m), weight 147 lb (66.7 kg), last menstrual period 11/04/2020, unknown if currently breastfeeding. Pregravid weight 136 lb (61.7 kg) Total Weight Gain 11 lb (4.99 kg) Urinalysis:      Fetal Status: Fetal Heart Rate (bpm): 160   Movement: Absent     General:  Alert, oriented and cooperative. Patient is in no acute distress.  Skin: Skin is warm and dry. No rash noted.   Cardiovascular: Normal heart rate noted  Respiratory: Normal respiratory effort, no problems with respiration noted  Abdomen: Soft, gravid, appropriate for gestational age. Pain/Pressure: Absent     Pelvic:  Cervical exam deferred         Extremities: Normal range of motion.  Edema: None  Mental Status: Normal mood and affect. Normal behavior. Normal judgment and thought content.     Assessment   30 y.o. G2P0010 at [redacted]w[redacted]d by  08/11/2021, by Last Menstrual Period presenting for routine prenatal visit  Plan   pregnancy2 Problems (from 11/04/20 to present)     Problem Noted Resolved   History of ectopic pregnancy 12/19/2020 by Gae Dry, MD No   Supervision of low-risk pregnancy, first trimester 12/19/2020 by Gae Dry, MD No   Overview Addendum 02/01/2021  5:01 PM by Homero Fellers, MD     Nursing Staff Provider  Office Location  [facility]ating   LMP = 7wk [place]  Language  English Anatomy US    Flu Vaccine   Genetic Screen  NIPS:   TDaP vaccine    Hgb A1C or  GTT Early : Third trimester :   Covid unvaccinated   LAB RESULTS   Rhogam  Not needed Blood Type A/Positive/-- (06/23 1504)   Feeding Plan  Antibody Negative (06/23 1504)  Contraception  Rubella 5.51 (06/23 1504)  Circumcision  RPR Non Reactive (06/23 1504)   Pediatrician   HBsAg Negative (06/23 1504)   Support Person  Richardson Landry and mother HIV Non Reactive (06/23 1504)  Prenatal Classes  Varicella  immune    GBS  (For PCN allergy, check sensitivities)   BTL Consent     VBAC Consent  Pap  2021 NIL    Hgb Electro      CF      SMA         Hx tobacco use in pregnancy- quit early in pregnancy Hx of sickle cell trait  Discussed prenatal classes in pregnancy. Patient reports she has a history of sickle cell trait with which was diagnosed when she was a child.  I discussed options for sickle cell screening with her partner which she will consider. Some anemia noted on her prenatal labs.  This may be considered related to her sickle cell trait.  Will obtain iron studies and replenish if necessary.  Discussed iron rich diet.  Gestational age appropriate obstetric precautions including but not limited to vaginal bleeding,  contractions, leaking of fluid and fetal movement were reviewed in detail with the patient.    Return for lab visit later this week or tomorrow- ROB in 4 weeks.  Homero Fellers MD [facility], [facility] 02/01/2021, 5:25 PM

## 2021-02-03 ENCOUNTER — Other Ambulatory Visit: Payer: Self-pay

## 2021-02-03 ENCOUNTER — Other Ambulatory Visit: Payer: Medicaid Other

## 2021-02-03 DIAGNOSIS — O99011 Anemia complicating pregnancy, first trimester: Secondary | ICD-10-CM

## 2021-02-04 LAB — FERRITIN: Ferritin: 15 ng/mL (ref 15–150)

## 2021-02-04 LAB — IRON AND TIBC
Iron Saturation: 24 % (ref 15–55)
Iron: 65 ug/dL (ref 27–159)
Total Iron Binding Capacity: 272 ug/dL (ref 250–450)
UIBC: 207 ug/dL (ref 131–425)

## 2021-02-04 LAB — VITAMIN B12: Vitamin B-12: 160 pg/mL — ABNORMAL LOW (ref 232–1245)

## 2021-03-01 ENCOUNTER — Ambulatory Visit (INDEPENDENT_AMBULATORY_CARE_PROVIDER_SITE_OTHER): Payer: Medicaid Other | Admitting: Obstetrics & Gynecology

## 2021-03-01 ENCOUNTER — Encounter: Payer: Self-pay | Admitting: Obstetrics & Gynecology

## 2021-03-01 ENCOUNTER — Other Ambulatory Visit: Payer: Self-pay

## 2021-03-01 VITALS — BP 80/60 | Wt 151.0 lb

## 2021-03-01 DIAGNOSIS — Z3491 Encounter for supervision of normal pregnancy, unspecified, first trimester: Secondary | ICD-10-CM

## 2021-03-01 DIAGNOSIS — Z3A16 16 weeks gestation of pregnancy: Secondary | ICD-10-CM

## 2021-03-01 DIAGNOSIS — Z3689 Encounter for other specified antenatal screening: Secondary | ICD-10-CM

## 2021-03-01 NOTE — Patient Instructions (Signed)

## 2021-03-01 NOTE — Progress Notes (Signed)
  Subjective  Improved nausea No VB, pain  Objective  BP (!) 80/60   Wt 151 lb (68.5 kg)   LMP 11/04/2020   BMI 25.13 kg/m  General: NAD Pumonary: no increased work of breathing Abdomen: gravid, non-tender Extremities: no edema Psychiatric: mood appropriate, affect full  Assessment  30 y.o. G2P0010 at 70w5dby  08/11/2021, by Last Menstrual Period presenting for routine prenatal visit  Plan   Problem List Items Addressed This Visit      Other   Supervision of low-risk pregnancy, first trimester - Primary  Other Visit Diagnoses    [redacted] weeks gestation of pregnancy       Screening, antenatal, for fetal anatomic survey       Relevant Orders   [place]OB Comp + 14 Wk    PNV Anat [place]     [facility]Gl[facility]er[facility]er[facility]   LMP = 7wk [place] Language  English Anatomy [place]   Flu Vaccine   Genetic Screen  NIPS: normal  TDaP vaccine    Hgb A1C or  GTT Early : Third trimester :   Covid unvaccinated   LAB RESULTS   Rhogam  Not needed Blood Type A/Positive/-- (06/23 1504)   Feeding Plan  Breast Antibody Negative (06/23 1504)  Contraception  Rubella 5.51 (06/23 1504)  Circumcision  RPR Non Reactive (06/23 1504)   Pediatrician   HBsAg Negative (06/23 1504)   Support Person  SRichardson Landryand mother HIV Non Reactive (06/23 1504)  Prenatal Classes  Varicella  immune    GBS  (For PCN allergy, check sensitivities)   BTL Consent     VBAC Consent  Pap  2021 NIL   Hx tobacco use in pregnancy- quit early in pregnancy Hx of sickle cell trait          PBarnett Applebaum MD, [place] [facility] 03/01/2021  10:27 AM

## 2021-03-27 ENCOUNTER — Ambulatory Visit
Admission: RE | Admit: 2021-03-27 | Discharge: 2021-03-27 | Disposition: A | Discharge status: Home / Self Care | Payer: Medicaid Other | Source: Ambulatory Visit | Attending: Obstetrics & Gynecology | Admitting: Obstetrics & Gynecology

## 2021-03-27 ENCOUNTER — Other Ambulatory Visit: Payer: Self-pay

## 2021-03-27 DIAGNOSIS — Z3689 Encounter for other specified antenatal screening: Secondary | ICD-10-CM | POA: Diagnosis not present

## 2021-03-30 ENCOUNTER — Other Ambulatory Visit: Payer: Self-pay

## 2021-03-30 ENCOUNTER — Ambulatory Visit (INDEPENDENT_AMBULATORY_CARE_PROVIDER_SITE_OTHER): Payer: Medicaid Other | Admitting: Obstetrics

## 2021-03-30 VITALS — BP 80/60 | Wt 162.0 lb

## 2021-03-30 DIAGNOSIS — Z3491 Encounter for supervision of normal pregnancy, unspecified, first trimester: Secondary | ICD-10-CM

## 2021-03-30 DIAGNOSIS — Z3A20 20 weeks gestation of pregnancy: Secondary | ICD-10-CM

## 2021-03-30 DIAGNOSIS — Z3480 Encounter for supervision of other normal pregnancy, unspecified trimester: Secondary | ICD-10-CM

## 2021-03-30 LAB — POCT URINALYSIS DIPSTICK OB
Glucose, UA: NEGATIVE
POC,PROTEIN,UA: NEGATIVE

## 2021-03-30 NOTE — Progress Notes (Signed)
  Routine Prenatal Care Visit  Subjective  Emily Levy is a 30 y.o. G2P0010 at 57w6dbeing seen today for ongoing prenatal care.  She is currently monitored for the following issues for this low-risk pregnancy and has Sinus tachycardia; Restless leg syndrome; Ectopic pregnancy of right ovary; Ovarian cyst, left; Pelvic pain; History of ectopic pregnancy; and Supervision of low-risk pregnancy, first trimester on their problem list.  ----------------------------------------------------------------------------------- Patient reports no complaints.  She is planning a gender reveal- but the chart had MaternT testing listed- MMF "revealed"  :( Contractions: Not present. Vag. Bleeding: None.  Movement: Present. Leaking Fluid denies.  ----------------------------------------------------------------------------------- The following portions of the patient's history were reviewed and updated as appropriate: allergies, current medications, past family history, past medical history, past social history, past surgical history and problem list. Problem list updated.  Objective  Blood pressure (!) 80/60, weight 162 lb (73.5 kg), last menstrual period 11/04/2020, unknown if currently breastfeeding. Pregravid weight 136 lb (61.7 kg) Total Weight Gain 26 lb (11.8 kg) Urinalysis: Urine Protein    Urine Glucose    Fetal Status:     Movement: Present     General:  Alert, oriented and cooperative. Patient is in no acute distress.  Skin: Skin is warm and dry. No rash noted.   Cardiovascular: Normal heart rate noted  Respiratory: Normal respiratory effort, no problems with respiration noted  Abdomen: Soft, gravid, appropriate for gestational age. Pain/Pressure: Absent     Pelvic:  Cervical exam deferred        Extremities: Normal range of motion.     Mental Status: Normal mood and affect. Normal behavior. Normal judgment and thought content.   Assessment   30y.o. G2P0010 at 23w6dy  08/11/2021, by Last  Menstrual Period presenting for routine prenatal visit  Plan   pregnancy2 Problems (from 11/04/20 to present)    Problem Noted Resolved   History of ectopic pregnancy 12/19/2020 by HaGae DryMD No   Supervision of low-risk pregnancy, first trimester 12/19/2020 by HaGae DryMD No   Overview Addendum 03/30/2021 10:42 AM by [name]     Nursing Staff Provider  Office Location  [facility]ating   LMP = 7wk [place]  English Anatomy US[place]Normal anatomy - female  Flu Vaccine   Genetic Screen  NIPS: normal  TDaP vaccine    Hgb A1C or  GTT Early : Third trimester :   Covid unvaccinated   LAB RESULTS   Rhogam  Not needed Blood Type A/Positive/-- (06/23 1504)   Feeding Plan  Breast Antibody Negative (06/23 1504)  Contraception  Rubella 5.51 (06/23 1504)  Circumcision  RPR Non Reactive (06/23 1504)   Pediatrician   HBsAg Negative (06/23 1504)   Support Person  [name] mother HIV Non Reactive (06/23 1504)  Prenatal Classes  Varicella  immune    GBS  (For PCN allergy, check sensitivities)   BTL Consent     VBAC Consent  Pap  2021 NIL   Hx tobacco use in pregnancy- quit early in pregnancy Hx of sickle cell trait          Preterm labor symptoms and general obstetric precautions including but not limited to vaginal bleeding, contractions, leaking of fluid and fetal movement were reviewed in detail with the patient. Please refer to After Visit Summary for other counseling recommendations.   No follow-ups on file.  MaImagene RichesCNM  03/30/2021 10:53 AM

## 2021-04-19 ENCOUNTER — Ambulatory Visit: Payer: Self-pay | Admitting: Obstetrics & Gynecology

## 2021-04-26 ENCOUNTER — Encounter: Payer: Medicaid Other | Admitting: Advanced Practice Midwife

## 2021-04-27 ENCOUNTER — Encounter: Payer: Self-pay | Admitting: Obstetrics and Gynecology

## 2021-04-27 ENCOUNTER — Other Ambulatory Visit: Payer: Self-pay

## 2021-04-27 ENCOUNTER — Ambulatory Visit (INDEPENDENT_AMBULATORY_CARE_PROVIDER_SITE_OTHER): Payer: Medicaid Other | Admitting: Obstetrics and Gynecology

## 2021-04-27 VITALS — BP 116/70 | Ht 65.0 in | Wt 165.0 lb

## 2021-04-27 DIAGNOSIS — Z3A24 24 weeks gestation of pregnancy: Secondary | ICD-10-CM

## 2021-04-27 DIAGNOSIS — Z3492 Encounter for supervision of normal pregnancy, unspecified, second trimester: Secondary | ICD-10-CM

## 2021-04-27 NOTE — Patient Instructions (Signed)

## 2021-04-27 NOTE — Progress Notes (Signed)
Routine Prenatal Care Visit  Subjective  Emily Levy is a 30 y.o. G2P0010 at [redacted]w[redacted]d being seen today for ongoing prenatal care.  She is currently monitored for the following issues for this low-risk pregnancy and has Sinus tachycardia; Restless leg syndrome; Ectopic pregnancy of right ovary; Ovarian cyst, left; Pelvic pain; History of ectopic pregnancy; and Supervision of low-risk pregnancy, first trimester on their problem list.  ----------------------------------------------------------------------------------- Patient reports no complaints.   Contractions: Not present. Vag. Bleeding: None.  Movement: Present. Denies leaking of fluid.  ----------------------------------------------------------------------------------- The following portions of the patient's history were reviewed and updated as appropriate: allergies, current medications, past family history, past medical history, past social history, past surgical history and problem list. Problem list updated.   Objective  Blood pressure 116/70, height 5\' 5"  (1.651 m), weight 165 lb (74.8 kg), last menstrual period 11/04/2020, unknown if currently breastfeeding. Pregravid weight 136 lb (61.7 kg) Total Weight Gain 29 lb (13.2 kg) Urinalysis:      Fetal Status: Fetal Heart Rate (bpm): 145 Fundal Height: 24 cm Movement: Present     General:  Alert, oriented and cooperative. Patient is in no acute distress.  Skin: Skin is warm and dry. No rash noted.   Cardiovascular: Normal heart rate noted  Respiratory: Normal respiratory effort, no problems with respiration noted  Abdomen: Soft, gravid, appropriate for gestational age. Pain/Pressure: Absent     Pelvic:  Cervical exam deferred        Extremities: Normal range of motion.  Edema: None  Mental Status: Normal mood and affect. Normal behavior. Normal judgment and thought content.     Assessment   30 y.o. G2P0010 at [redacted]w[redacted]d by  08/11/2021, by Last Menstrual Period presenting for  routine prenatal visit  Plan   pregnancy2 Problems (from 11/04/20 to present)     Problem Noted Resolved   History of ectopic pregnancy 12/19/2020 by Gae Dry, MD No   Supervision of low-risk pregnancy, first trimester 12/19/2020 by Gae Dry, MD No   Overview Addendum 03/30/2021 10:42 AM by Imagene Riches, CNM     Nursing Staff Provider  Office Location  [facility]ating   LMP = 7wk [place]  Language  English Anatomy US  Normal anatomy - female  Flu Vaccine   Genetic Screen  NIPS: normal  TDaP vaccine    Hgb A1C or  GTT Early : Third trimester :   Covid unvaccinated   LAB RESULTS   Rhogam  Not needed Blood Type A/Positive/-- (06/23 1504)   Feeding Plan  Breast Antibody Negative (06/23 1504)  Contraception  Rubella 5.51 (06/23 1504)  Circumcision  RPR Non Reactive (06/23 1504)   Pediatrician   HBsAg Negative (06/23 1504)   Support Person  Richardson Landry and mother HIV Non Reactive (06/23 1504)  Prenatal Classes  Varicella  immune    GBS  (For PCN allergy, check sensitivities)   BTL Consent     VBAC Consent  Pap  2021 NIL   Hx tobacco use in pregnancy- quit early in pregnancy Hx of sickle cell trait          Discussed prenatal classes through hospital.  28 week labs ordered.  Patient spoke with Izora Gala regarding the disappointment of having the gender revealed unexpectedly at her last visit.   Gestational age appropriate obstetric precautions including but not limited to vaginal bleeding, contractions, leaking of fluid and fetal movement were reviewed in detail with the patient.    Return in about 4  weeks (around 05/25/2021) for ROB and 1 GTT.  Homero Fellers MD [facility], [facility] 04/27/2021, 2:28 PM

## 2021-05-25 ENCOUNTER — Other Ambulatory Visit: Payer: Self-pay

## 2021-05-25 ENCOUNTER — Other Ambulatory Visit: Payer: Medicaid Other

## 2021-05-25 ENCOUNTER — Ambulatory Visit (INDEPENDENT_AMBULATORY_CARE_PROVIDER_SITE_OTHER): Payer: Medicaid Other | Admitting: Obstetrics & Gynecology

## 2021-05-25 ENCOUNTER — Encounter: Payer: Self-pay | Admitting: Obstetrics & Gynecology

## 2021-05-25 VITALS — BP 100/60 | Wt 177.0 lb

## 2021-05-25 DIAGNOSIS — Z3A28 28 weeks gestation of pregnancy: Secondary | ICD-10-CM

## 2021-05-25 DIAGNOSIS — Z3491 Encounter for supervision of normal pregnancy, unspecified, first trimester: Secondary | ICD-10-CM

## 2021-05-25 DIAGNOSIS — Z3492 Encounter for supervision of normal pregnancy, unspecified, second trimester: Secondary | ICD-10-CM

## 2021-05-25 DIAGNOSIS — Z3483 Encounter for supervision of other normal pregnancy, third trimester: Secondary | ICD-10-CM

## 2021-05-25 LAB — POCT URINALYSIS DIPSTICK OB
Glucose, UA: NEGATIVE
POC,PROTEIN,UA: NEGATIVE

## 2021-05-25 NOTE — Patient Instructions (Signed)

## 2021-05-25 NOTE — Progress Notes (Signed)
  Subjective  Fetal Movement? yes Contractions? no Leaking Fluid? no Vaginal Bleeding? no  Objective  BP 100/60   Wt 177 lb (80.3 kg)   LMP 11/04/2020   BMI 29.45 kg/m  General: NAD Pumonary: no increased work of breathing Abdomen: gravid, non-tender Extremities: no edema Psychiatric: mood appropriate, affect full  Assessment  30 y.o. G2P0010 at [redacted]w[redacted]d by  08/11/2021, by Last Menstrual Period presenting for routine prenatal visit  Plan   Problem List Items Addressed This Visit      Other   Supervision of low-risk pregnancy, first trimester  Other Visit Diagnoses    Encounter for supervision of other normal pregnancy in third trimester    -  Primary   Relevant Orders   POC Urinalysis Dipstick OB (Completed)   [redacted] weeks gestation of pregnancy        PNV, [place] today Declines flu shot Discussed birth control options    Considering POP or Nexplanon  pregnancy2 Problems (from 11/04/20 to present)    Problem Noted Resolved   History of ectopic pregnancy 12/19/2020 by Gae Dry, MD No   Supervision of low-risk pregnancy, first trimester 12/19/2020 by Gae Dry, MD No   Overview Addendum 05/25/2021  3:26 PM by Gae Dry, MD     Nursing Staff Provider  Office Location  [facility]ating   LMP = 7wk [place]  Language  English Anatomy US  Normal anatomy - female  Flu Vaccine  Declines Genetic Screen  NIPS: normal  TDaP vaccine    Hgb A1C or  GTT Third trimester :   Covid unvaccinated   LAB RESULTS   Rhogam  Not needed Blood Type A/Positive/-- (06/23 1504)   Feeding Plan  Breast Antibody Negative (06/23 1504)  Contraception  POP or Nexplanon Rubella 5.51 (06/23 1504)  Circumcision  RPR Non Reactive (06/23 1504)   Pediatrician   HBsAg Negative (06/23 1504)   Support Person  Richardson Landry and mother HIV Non Reactive (06/23 1504)  Prenatal Classes Discussed Varicella  immune    GBS  (For PCN allergy, check sensitivities)   BTL Consent No    VBAC Consent N/a Pap   2021 NIL   Hx tobacco use in pregnancy- quit early in pregnancy Hx of sickle cell trait          Barnett Applebaum, MD, [facility] 05/25/2021  3:28 PM\

## 2021-05-26 LAB — 28 WEEK RH+PANEL
Basophils Absolute: 0.1 10*3/uL (ref 0.0–0.2)
Basos: 0 %
EOS (ABSOLUTE): 0.1 10*3/uL (ref 0.0–0.4)
Eos: 1 %
Gestational Diabetes Screen: 52 mg/dL — ABNORMAL LOW (ref 70–139)
HIV Screen 4th Generation wRfx: NONREACTIVE
Hematocrit: 28.2 % — ABNORMAL LOW (ref 34.0–46.6)
Hemoglobin: 9.4 g/dL — ABNORMAL LOW (ref 11.1–15.9)
Immature Grans (Abs): 0.3 10*3/uL — ABNORMAL HIGH (ref 0.0–0.1)
Immature Granulocytes: 2 %
Lymphocytes Absolute: 3.7 10*3/uL — ABNORMAL HIGH (ref 0.7–3.1)
Lymphs: 24 %
MCH: 28.1 pg (ref 26.6–33.0)
MCHC: 33.3 g/dL (ref 31.5–35.7)
MCV: 84 fL (ref 79–97)
Monocytes Absolute: 1.7 10*3/uL — ABNORMAL HIGH (ref 0.1–0.9)
Monocytes: 11 %
Neutrophils Absolute: 9.6 10*3/uL — ABNORMAL HIGH (ref 1.4–7.0)
Neutrophils: 62 %
Platelets: 306 10*3/uL (ref 150–450)
RBC: 3.35 x10E6/uL — ABNORMAL LOW (ref 3.77–5.28)
RDW: 12.5 % (ref 11.7–15.4)
RPR Ser Ql: NONREACTIVE
WBC: 15.4 10*3/uL — ABNORMAL HIGH (ref 3.4–10.8)

## 2021-06-07 ENCOUNTER — Ambulatory Visit (INDEPENDENT_AMBULATORY_CARE_PROVIDER_SITE_OTHER): Payer: Medicaid Other | Admitting: Obstetrics and Gynecology

## 2021-06-07 ENCOUNTER — Other Ambulatory Visit: Payer: Self-pay

## 2021-06-07 VITALS — BP 100/60 | Wt 187.0 lb

## 2021-06-07 DIAGNOSIS — Z3483 Encounter for supervision of other normal pregnancy, third trimester: Secondary | ICD-10-CM

## 2021-06-07 DIAGNOSIS — Z3A30 30 weeks gestation of pregnancy: Secondary | ICD-10-CM

## 2021-06-07 LAB — POCT URINALYSIS DIPSTICK OB
Glucose, UA: NEGATIVE
POC,PROTEIN,UA: NEGATIVE

## 2021-06-07 NOTE — Addendum Note (Signed)
Addended by: Drenda Freeze on: 06/07/2021 03:19 PM   Modules accepted: Orders

## 2021-06-07 NOTE — Progress Notes (Signed)
  Subjective  Fetal Movement? yes Contractions? no Leaking Fluid? no Vaginal Bleeding? no PNVs? yes  PNVs without iron, instructed pt to add Fe supp QD per 28 wk lab results (she wasn't aware).  Pt interested in water birth, but informed not done at [facility]. She thinks done in [place] and pt aware she would need to do care with [facility] to deliver there.  Declines flu. Will BF; pump Rx given to pt. Prob wants POPs  Objective  BP 100/60   Wt 187 lb (84.8 kg)   LMP 11/04/2020   BMI 31.12 kg/m  General: NAD Pulmonary: no increased work of breathing Abdomen: gravid, non-tender Extremities: no edema Psychiatric: mood appropriate, affect full  Assessment  30 y.o. G2P0010 at [redacted]w[redacted]d by  08/11/2021, by Last Menstrual Period presenting for routine prenatal visit  Plan   Problem List Items Addressed This Visit   None Visit Diagnoses    Encounter for supervision of other normal pregnancy in third trimester    -  Primary   [redacted] weeks gestation of pregnancy         Labs: none  RTO 2 weeks  Lynne Righi B. Bridget , PA-C [facility],  06/07/2021  3:09 PM

## 2021-06-07 NOTE — Progress Notes (Signed)
ROB- no concerns 

## 2021-06-19 ENCOUNTER — Other Ambulatory Visit: Payer: Self-pay

## 2021-06-19 ENCOUNTER — Ambulatory Visit (INDEPENDENT_AMBULATORY_CARE_PROVIDER_SITE_OTHER): Payer: Medicaid Other | Admitting: Obstetrics

## 2021-06-19 VITALS — BP 90/60 | Wt 186.0 lb

## 2021-06-19 DIAGNOSIS — Z3A32 32 weeks gestation of pregnancy: Secondary | ICD-10-CM

## 2021-06-19 DIAGNOSIS — Z3480 Encounter for supervision of other normal pregnancy, unspecified trimester: Secondary | ICD-10-CM

## 2021-06-19 DIAGNOSIS — Z3483 Encounter for supervision of other normal pregnancy, third trimester: Secondary | ICD-10-CM | POA: Diagnosis not present

## 2021-06-19 DIAGNOSIS — Z3491 Encounter for supervision of normal pregnancy, unspecified, first trimester: Secondary | ICD-10-CM

## 2021-06-19 NOTE — Progress Notes (Signed)
Routine Prenatal Care Visit  Subjective  Emily Levy is a 30 y.o. G2P0010 at [redacted]w[redacted]d being seen today for ongoing prenatal care.  She is currently monitored for the following issues for this low-risk pregnancy and has Sinus tachycardia; Restless leg syndrome; Ectopic pregnancy of right ovary; Ovarian cyst, left; Pelvic pain; History of ectopic pregnancy; and Supervision of low-risk pregnancy, first trimester on their problem list.  ----------------------------------------------------------------------------------- Patient reports no complaints.    .  .   Jacklyn Shell Fluid denies.  ----------------------------------------------------------------------------------- The following portions of the patient's history were reviewed and updated as appropriate: allergies, current medications, past family history, past medical history, past social history, past surgical history and problem list. Problem list updated.  Objective  Blood pressure 90/60, weight 186 lb (84.4 kg), last menstrual period 11/04/2020, unknown if currently breastfeeding. Pregravid weight 136 lb (61.7 kg) Total Weight Gain 50 lb (22.7 kg) Urinalysis: Urine Protein    Urine Glucose    Fetal Status:           General:  Alert, oriented and cooperative. Patient is in no acute distress.  Skin: Skin is warm and dry. No rash noted.   Cardiovascular: Normal heart rate noted  Respiratory: Normal respiratory effort, no problems with respiration noted  Abdomen: Soft, gravid, appropriate for gestational age.       Pelvic:  Cervical exam deferred        Extremities: Normal range of motion.     Mental Status: Normal mood and affect. Normal behavior. Normal judgment and thought content.   Assessment   30 y.o. G2P0010 at [redacted]w[redacted]d by  08/11/2021, by Last Menstrual Period presenting for routine prenatal visit  Plan   pregnancy2 Problems (from 11/04/20 to present)    Problem Noted Resolved   History of ectopic pregnancy 12/19/2020 by  Gae Dry, MD No   Supervision of low-risk pregnancy, first trimester 12/19/2020 by Gae Dry, MD No   Overview Addendum 06/19/2021  3:59 PM by Imagene Riches, CNM     Nursing Staff Provider  Office Location  [facility] Dating   LMP = 7wk [place]  Language  English Anatomy US  Normal anatomy - female  Flu Vaccine  Declines Genetic Screen  NIPS: normal  TDaP vaccine    Hgb A1C or  GTT Third trimester : 52  Covid unvaccinated   LAB RESULTS   Rhogam  Not needed Blood Type A/Positive/-- (06/23 1504)   Feeding Plan  Breast Antibody Negative (06/23 1504)  Contraception  POP or Nexplanon Rubella 5.51 (06/23 1504)  Circumcision  RPR Non Reactive (10/27 1542)   Pediatrician   HBsAg Negative (06/23 1504)   Support Person  Richardson Landry and mother HIV Non Reactive (10/27 1542)  Prenatal Classes Discussed Varicella  immune    GBS  (For PCN allergy, check sensitivities)   BTL Consent No    VBAC Consent N/a Pap  2021 NIL   Hx tobacco use in pregnancy- quit early in pregnancy Hx of sickle cell trait          Preterm labor symptoms and general obstetric precautions including but not limited to vaginal bleeding, contractions, leaking of fluid and fetal movement were reviewed in detail with the patient. Please refer to After Visit Summary for other counseling recommendations.  Her H and H were lower at her 28 week visit. Daily iron advised. She does not eat meat- suggested citrus with her iron tabs, potato skins, egg yolks and lots of greens. Will check her  H and H next visit.  Return in about 2 weeks (around 07/03/2021) for return OB.  Imagene Riches, CNM  06/19/2021 4:00 PM

## 2021-06-19 NOTE — Progress Notes (Signed)
ROB- no concerns 

## 2021-07-06 ENCOUNTER — Encounter: Payer: Medicaid Other | Admitting: Obstetrics

## 2021-07-06 ENCOUNTER — Ambulatory Visit (INDEPENDENT_AMBULATORY_CARE_PROVIDER_SITE_OTHER): Payer: Medicaid Other | Admitting: Obstetrics

## 2021-07-06 ENCOUNTER — Other Ambulatory Visit: Payer: Self-pay

## 2021-07-06 VITALS — BP 110/70 | Wt 190.0 lb

## 2021-07-06 DIAGNOSIS — O99012 Anemia complicating pregnancy, second trimester: Secondary | ICD-10-CM

## 2021-07-06 DIAGNOSIS — Z3A34 34 weeks gestation of pregnancy: Secondary | ICD-10-CM

## 2021-07-06 NOTE — Progress Notes (Signed)
No vb. No lof. Needs recheck CBC today

## 2021-07-06 NOTE — Progress Notes (Deleted)
  Routine Prenatal Care Visit  Subjective  Emily Levy Standard is a 30 y.o. G2P0010 at [redacted]w[redacted]d being seen today for ongoing prenatal care.  She is currently monitored for the following issues for this {Blank single:19197::"high-risk","low-risk"} pregnancy and has Sinus tachycardia; Restless leg syndrome; Ectopic pregnancy of right ovary; Ovarian cyst, left; Pelvic pain; History of ectopic pregnancy; and Supervision of low-risk pregnancy, first trimester on their problem list.  ----------------------------------------------------------------------------------- Patient reports {sx:14538}.   Contractions: Not present. Vag. Bleeding: None.  Movement: Present. Leaking Fluid {Actions; denies/reports/admits to:19208}.  ----------------------------------------------------------------------------------- The following portions of the patient's history were reviewed and updated as appropriate: allergies, current medications, past family history, past medical history, past social history, past surgical history and problem list. Problem list updated.  Objective  Blood pressure 110/70, weight 190 lb (86.2 kg), last menstrual period 11/04/2020, unknown if currently breastfeeding. Pregravid weight 136 lb (61.7 kg) Total Weight Gain 54 lb (24.5 kg) Urinalysis: Urine Protein    Urine Glucose    Fetal Status:     Movement: Present     General:  Alert, oriented and cooperative. Patient is in no acute distress.  Skin: Skin is warm and dry. No rash noted.   Cardiovascular: Normal heart rate noted  Respiratory: Normal respiratory effort, no problems with respiration noted  Abdomen: Soft, gravid, appropriate for gestational age. Pain/Pressure: Absent     Pelvic:  {Blank single:19197::"Cervical exam performed","Cervical exam deferred"}        Extremities: Normal range of motion.     Mental Status: Normal mood and affect. Normal behavior. Normal judgment and thought content.   Assessment   30 y.o. G2P0010 at [redacted]w[redacted]d  by  08/11/2021, by Last Menstrual Period presenting for {Blank single:19197::"routine","work-in"} prenatal visit  Plan   pregnancy2 Problems (from 11/04/20 to present)    Problem Noted Resolved   History of ectopic pregnancy 12/19/2020 by Gae Dry, MD No   Supervision of low-risk pregnancy, first trimester 12/19/2020 by Gae Dry, MD No   Overview Addendum 06/19/2021  3:59 PM by Imagene Riches, CNM     Nursing Staff Provider  Office Location  [facility] Dating   LMP = 7wk [place]  Language  English Anatomy US  Normal anatomy - female  Flu Vaccine  Declines Genetic Screen  NIPS: normal  TDaP vaccine    Hgb A1C or  GTT Third trimester : 52  Covid unvaccinated   LAB RESULTS   Rhogam  Not needed Blood Type A/Positive/-- (06/23 1504)   Feeding Plan  Breast Antibody Negative (06/23 1504)  Contraception  POP or Nexplanon Rubella 5.51 (06/23 1504)  Circumcision  RPR Non Reactive (10/27 1542)   Pediatrician   HBsAg Negative (06/23 1504)   Support Person  Richardson Landry and mother HIV Non Reactive (10/27 1542)  Prenatal Classes Discussed Varicella  immune    GBS  (For PCN allergy, check sensitivities)   BTL Consent No    VBAC Consent N/a Pap  2021 NIL   Hx tobacco use in pregnancy- quit early in pregnancy Hx of sickle cell trait          {Blank single:19197::"Term","Preterm"} labor symptoms and general obstetric precautions including but not limited to vaginal bleeding, contractions, leaking of fluid and fetal movement were reviewed in detail with the patient. Please refer to After Visit Summary for other counseling recommendations.   Return in about 1 week (around 07/13/2021) for return OB, GBS, cultures.  Imagene Riches, CNM  07/06/2021 2:40 PM

## 2021-07-07 LAB — CBC WITH DIFFERENTIAL
Basophils Absolute: 0 10*3/uL (ref 0.0–0.2)
Basos: 0 %
EOS (ABSOLUTE): 0.1 10*3/uL (ref 0.0–0.4)
Eos: 1 %
Hematocrit: 28.7 % — ABNORMAL LOW (ref 34.0–46.6)
Hemoglobin: 9.3 g/dL — ABNORMAL LOW (ref 11.1–15.9)
Immature Grans (Abs): 0.1 10*3/uL (ref 0.0–0.1)
Immature Granulocytes: 1 %
Lymphocytes Absolute: 2.8 10*3/uL (ref 0.7–3.1)
Lymphs: 21 %
MCH: 27.4 pg (ref 26.6–33.0)
MCHC: 32.4 g/dL (ref 31.5–35.7)
MCV: 85 fL (ref 79–97)
Monocytes Absolute: 1.4 10*3/uL — ABNORMAL HIGH (ref 0.1–0.9)
Monocytes: 11 %
Neutrophils Absolute: 9.1 10*3/uL — ABNORMAL HIGH (ref 1.4–7.0)
Neutrophils: 66 %
RBC: 3.39 x10E6/uL — ABNORMAL LOW (ref 3.77–5.28)
RDW: 15.1 % (ref 11.7–15.4)
WBC: 13.5 10*3/uL — ABNORMAL HIGH (ref 3.4–10.8)

## 2021-07-11 ENCOUNTER — Other Ambulatory Visit: Payer: Self-pay | Admitting: Obstetrics

## 2021-07-11 ENCOUNTER — Encounter: Payer: Self-pay | Admitting: Obstetrics

## 2021-07-11 DIAGNOSIS — O99012 Anemia complicating pregnancy, second trimester: Secondary | ICD-10-CM

## 2021-07-12 ENCOUNTER — Ambulatory Visit (INDEPENDENT_AMBULATORY_CARE_PROVIDER_SITE_OTHER): Payer: Medicaid Other | Admitting: Licensed Practical Nurse

## 2021-07-12 ENCOUNTER — Other Ambulatory Visit: Payer: Self-pay

## 2021-07-12 VITALS — BP 118/74 | Wt 191.0 lb

## 2021-07-12 DIAGNOSIS — Z3685 Encounter for antenatal screening for Streptococcus B: Secondary | ICD-10-CM

## 2021-07-12 DIAGNOSIS — Z3A35 35 weeks gestation of pregnancy: Secondary | ICD-10-CM

## 2021-07-12 DIAGNOSIS — O26843 Uterine size-date discrepancy, third trimester: Secondary | ICD-10-CM

## 2021-07-12 DIAGNOSIS — Z3400 Encounter for supervision of normal first pregnancy, unspecified trimester: Secondary | ICD-10-CM

## 2021-07-12 NOTE — Progress Notes (Signed)
GBS and aptima today.

## 2021-07-12 NOTE — Progress Notes (Signed)
Routine Prenatal Care Visit  Subjective  Emily Levy is a 30 y.o. G2P0010 at [redacted]w[redacted]d being seen today for ongoing prenatal care.  She is currently monitored for the following issues for this low-risk pregnancy and has Sinus tachycardia; Restless leg syndrome; Ectopic pregnancy of right ovary; Ovarian cyst, left; Pelvic pain; History of ectopic pregnancy; and Supervision of low-risk pregnancy, first trimester on their problem list.  ----------------------------------------------------------------------------------- Patient reports had some round ligament pain the other night. Here with her mom.  Desires to transfer care to [facility] as she desires a Microbiologist.  Contractions: Not present. Vag. Bleeding: None.  Movement: Present. Leaking Fluid denies.  ----------------------------------------------------------------------------------- The following portions of the patient's history were reviewed and updated as appropriate: allergies, current medications, past family history, past medical history, past social history, past surgical history and problem list. Problem list updated.  Objective  Blood pressure 118/74, weight 191 lb (86.6 kg), last menstrual period 11/04/2020, unknown if currently breastfeeding. Pregravid weight 136 lb (61.7 kg) Total Weight Gain 55 lb (24.9 kg) Urinalysis: Urine Protein    Urine Glucose    Fetal Status: Fetal Heart Rate (bpm): 140 Fundal Height: 32 cm Movement: Present     General:  Alert, oriented and cooperative. Patient is in no acute distress.  Skin: Skin is warm and dry. No rash noted.   Cardiovascular: Normal heart rate noted  Respiratory: Normal respiratory effort, no problems with respiration noted  Abdomen: Soft, gravid, appropriate for gestational age. Pain/Pressure: Absent     Pelvic:  Cervical exam deferred        Extremities: Normal range of motion.  Edema: None  Mental Status: Normal mood and affect. Normal behavior. Normal judgment and thought  content.   Assessment   30 y.o. G2P0010 at [redacted]w[redacted]d by  08/11/2021, by Last Menstrual Period presenting for routine prenatal visit  Size less than dates Anemia in pregnancy   Plan   pregnancy2 Problems (from 11/04/20 to present)    Problem Noted Resolved   History of ectopic pregnancy 12/19/2020 by Gae Dry, MD No   Supervision of low-risk pregnancy, first trimester 12/19/2020 by Gae Dry, MD No   Overview Addendum 06/19/2021  3:59 PM by Imagene Riches, CNM     Nursing Staff Provider  Office Location  [facility] Dating   LMP = 7wk [place]  Language  English Anatomy US  Normal anatomy - female  Flu Vaccine  Declines Genetic Screen  NIPS: normal  TDaP vaccine    Hgb A1C or  GTT Third trimester : 52  Covid unvaccinated   LAB RESULTS   Rhogam  Not needed Blood Type A/Positive/-- (06/23 1504)   Feeding Plan  Breast Antibody Negative (06/23 1504)  Contraception  POP or Nexplanon Rubella 5.51 (06/23 1504)  Circumcision yes RPR Non Reactive (10/27 1542)   Pediatrician   HBsAg Negative (06/23 1504)   Support Person  Richardson Landry and mother HIV Non Reactive (10/27 1542)  Prenatal Classes Discussed Varicella  immune    GBS  (For PCN allergy, check sensitivities)   BTL Consent No    VBAC Consent N/a Pap  2021 NIL   Hx tobacco use in pregnancy- quit early in pregnancy Hx of sickle cell trait          Preterm labor symptoms and general obstetric precautions including but not limited to vaginal bleeding, contractions, leaking of fluid and fetal movement were reviewed in detail with the patient. Please refer to After Visit Summary for other counseling recommendations.  GBS collected Keep next appointments (utlrasound, hematology, ROB) until care established at [facility], [place], [facility]  07/12/21  4:32 PM

## 2021-07-17 LAB — CULTURE, BETA STREP (GROUP B ONLY): Strep Gp B Culture: NEGATIVE

## 2021-07-18 ENCOUNTER — Encounter: Payer: Self-pay | Admitting: *Deleted

## 2021-07-19 ENCOUNTER — Other Ambulatory Visit: Payer: Self-pay

## 2021-07-19 ENCOUNTER — Ambulatory Visit (INDEPENDENT_AMBULATORY_CARE_PROVIDER_SITE_OTHER): Payer: Medicaid Other | Admitting: Obstetrics & Gynecology

## 2021-07-19 ENCOUNTER — Encounter: Payer: Self-pay | Admitting: Obstetrics & Gynecology

## 2021-07-19 VITALS — BP 122/70 | Wt 200.0 lb

## 2021-07-19 DIAGNOSIS — Z3400 Encounter for supervision of normal first pregnancy, unspecified trimester: Secondary | ICD-10-CM

## 2021-07-19 DIAGNOSIS — Z3A36 36 weeks gestation of pregnancy: Secondary | ICD-10-CM

## 2021-07-19 NOTE — Patient Instructions (Signed)
First Stage of Labor Labor is your body's natural process of moving your baby and other structures, including the placenta and umbilical cord, out of your uterus. There are three stages of labor. How long each stage lasts is different for every person. However, certain events happen during each stage that are the same for everyone. The first stage starts when true labor begins. This stage ends when your cervix, which is the opening from your uterus into your vagina, is completely open (dilated). Once your cervix is fully dilated you will be ready to enter the second stage of labor and start pushing. First stage of labor The first stage of labor begins when you start having regular painful contractions that come at evenly spaced intervals. During this stage, your cervix starts to get thinner and opens in preparation for your baby to pass through. Health care providers measure the dilation of your cervix in centimeters (cm). One centimeter is a little less than one-half of an inch. The first stage ends when your cervix is dilated to 10 cm. The first stage of labor is divided into three phases: Early phase. Active phase. Transitional phase. The length of the first stage of labor varies. It may be longer if this is your first pregnancy. You may spend some of this stage at home trying to relax and stay comfortable. How does this affect me? During the first stage of labor, you will move through three phases. What happens in the early phase? You will start to have regular contractions that last 30-60 seconds. Contractions may come every 5-20 minutes. Keep track of your contractions and call your health care provider. Your water may break (rupture of membranes). This is when the sac of fluid that surrounds your baby breaks. You may notice a clear or slightly bloody discharge of mucus (mucus plug) from your vagina. What happens in the active phase? The active phase usually lasts 3-5 hours. You may go to the  hospital or birth center around this time. During the active phase: Your contractions will become stronger, longer, and more uncomfortable. Your contractions may last 45-90 seconds and come every 3-5 minutes. Your contractions will be painful. Most people feel abdominal pain with contractions, but you may also feel lower back pain. What happens in the transitional phase? The transitional phase typically lasts from 30 minutes to 2 hours. At the end of this phase, your cervix will be fully dilated to 10 cm. During the transitional phase: Contractions will get stronger, longer, and more intense. Contractions may last 60-90 seconds and come less than 2 minutes apart. You may feel hot flashes, chills, or nausea. How does this affect my baby? During the first stage of labor, your baby will gradually move down into your birth canal. Follow these instructions at home:  When labor first begins, try to stay calm. You are still in the early phase. During this time it's important to rest and stay hydrated. You may want to make some calls and get ready to go to the hospital or birth center. When you are in the early phase, try these methods to help ease discomfort: Deep breathing and muscle relaxation. Taking a walk. Taking a warm bath or shower. Drink some fluids and have a light snack if you feel like it. Keep track of your contractions. Based on the plan you created with your health care provider, call when your contractions indicate it is time. If your water breaks, note the time, color, and odor of the fluid.  Contact a health care provider if: Your contractions are strong and regular. You have lower back pain or cramping. Your water breaks. You lose your mucus plug. Get help right away if: You have a severe headache that does not go away. You have changes in your vision. You have severe pain in your upper belly. You do not feel the baby move. You have bright red bleeding. Summary The first  stage of labor starts when true labor begins, and it ends when your cervix is dilated to 10 cm. The first stage of labor has three phases: early, active, and transitional. Your baby moves into the birth canal during the first stage of labor. Call your health care provider if you think your labor is starting. This information is not intended to replace advice given to you by your health care provider. Make sure you discuss any questions you have with your health care provider. Document Revised: 11/29/2020 Document Reviewed: 11/29/2020 Elsevier Patient Education  [place].

## 2021-07-19 NOTE — Progress Notes (Signed)
°  Subjective  Fetal Movement? yes Contractions? Occas BHs Leaking Fluid? no Vaginal Bleeding? no  Objective  BP 122/70    Wt 200 lb (90.7 kg)    LMP 11/04/2020    BMI 33.28 kg/m  General: NAD Pumonary: no increased work of breathing Abdomen: gravid, non-tender Extremities: no edema Psychiatric: mood appropriate, affect full  Assessment  30 y.o. G2P0010 at [redacted]w[redacted]d by  08/11/2021, by Last Menstrual Period presenting for routine prenatal visit  Plan   Problem List Items Addressed This Visit   None Visit Diagnoses     Supervision of normal first pregnancy, antepartum    -  Primary   [redacted] weeks gestation of pregnancy         GBS neg discussed Labor precautions discussed PNV, [place]  pregnancy2 Problems (from 11/04/20 to present)     Problem Noted Resolved   History of ectopic pregnancy 12/19/2020 by Gae Dry, MD No   Supervision of low-risk pregnancy, first trimester 12/19/2020 by Gae Dry, MD No   Overview Addendum 06/19/2021  3:59 PM by Imagene Riches, CNM     Nursing Staff Provider  Office Location  [facility] Dating   LMP = 7wk [place]  Language  English Anatomy US  Normal anatomy - female  Flu Vaccine  Declines Genetic Screen  NIPS: normal  TDaP vaccine    Hgb A1C or  GTT Third trimester : 52  Covid unvaccinated   LAB RESULTS   Rhogam  Not needed Blood Type A/Positive/-- (06/23 1504)   Feeding Plan  Breast Antibody Negative (06/23 1504)  Contraception  POP or Nexplanon Rubella 5.51 (06/23 1504)  Circumcision  RPR Non Reactive (10/27 1542)   Pediatrician   HBsAg Negative (06/23 1504)   Support Person  Richardson Landry and mother HIV Non Reactive (10/27 1542)  Prenatal Classes Discussed Varicella  immune    GBS  (For PCN allergy, check sensitivities)   BTL Consent No    VBAC Consent N/a Pap  2021 NIL   Hx tobacco use in pregnancy- quit early in pregnancy Hx of sickle cell trait           Barnett Applebaum, MD, [place], [facility] 07/19/2021   3:52 PM

## 2021-07-20 ENCOUNTER — Ambulatory Visit
Admission: RE | Admit: 2021-07-20 | Discharge: 2021-07-20 | Disposition: A | Discharge status: Home / Self Care | Payer: Medicaid Other | Source: Ambulatory Visit | Attending: Licensed Practical Nurse | Admitting: Licensed Practical Nurse

## 2021-07-20 ENCOUNTER — Telehealth: Payer: Self-pay | Admitting: *Deleted

## 2021-07-20 DIAGNOSIS — O26843 Uterine size-date discrepancy, third trimester: Secondary | ICD-10-CM | POA: Insufficient documentation

## 2021-07-20 NOTE — Telephone Encounter (Signed)
Contacted NP prior to her apt with Dr. Tasia Catchings (on 07/25/21). Pt being referred for anemia r/t to pregnancy. Pt thanked me for calling her. She did not have any additional questions or concerns at this time.

## 2021-07-21 ENCOUNTER — Other Ambulatory Visit: Payer: Self-pay | Admitting: Licensed Practical Nurse

## 2021-07-21 DIAGNOSIS — O365990 Maternal care for other known or suspected poor fetal growth, unspecified trimester, not applicable or unspecified: Secondary | ICD-10-CM | POA: Insufficient documentation

## 2021-07-21 DIAGNOSIS — O365930 Maternal care for other known or suspected poor fetal growth, third trimester, not applicable or unspecified: Secondary | ICD-10-CM

## 2021-07-21 NOTE — Progress Notes (Addendum)
[place] on 12/22:  Assigned GA currently 36 weeks 6 days. Fetus measures small for gestational age, with EFW currently at 8 %ile.   Amniotic fluid volume within normal limits, with AFI of 12.3 cm.    Reviewed [place] results with Dr Kenton Kingfisher. Will plan for Doppler studies, NST in office and birth by 39 weeks.   Giovannina called, reviewed US findings and recommendations. Verbalized understanding. Will think about when she would like to schedule her induction and get back to me.   Order for doppler studies placed, LVM with scheduling to ensure apt is made for Tuesday with MFM.   Roberto Scales, [place], [facility]  07/21/21  1:01 PM

## 2021-07-25 ENCOUNTER — Ambulatory Visit: Payer: Medicaid Other | Attending: Licensed Practical Nurse

## 2021-07-25 ENCOUNTER — Inpatient Hospital Stay: Payer: Medicaid Other

## 2021-07-25 ENCOUNTER — Ambulatory Visit (HOSPITAL_BASED_OUTPATIENT_CLINIC_OR_DEPARTMENT_OTHER): Payer: Medicaid Other | Admitting: *Deleted

## 2021-07-25 ENCOUNTER — Other Ambulatory Visit: Payer: Self-pay | Admitting: Licensed Practical Nurse

## 2021-07-25 ENCOUNTER — Encounter: Payer: Self-pay | Admitting: *Deleted

## 2021-07-25 ENCOUNTER — Inpatient Hospital Stay
Admission: AD | Admit: 2021-07-25 | Discharge: 2021-07-29 | DRG: 786 | Disposition: A | Discharge status: Home / Self Care | Payer: Medicaid Other | Source: Ambulatory Visit | Attending: Obstetrics | Admitting: Obstetrics

## 2021-07-25 ENCOUNTER — Encounter: Payer: Self-pay | Admitting: Obstetrics & Gynecology

## 2021-07-25 ENCOUNTER — Inpatient Hospital Stay: Payer: Medicaid Other | Attending: Oncology | Admitting: Oncology

## 2021-07-25 ENCOUNTER — Ambulatory Visit (HOSPITAL_BASED_OUTPATIENT_CLINIC_OR_DEPARTMENT_OTHER): Payer: Medicaid Other | Admitting: Maternal & Fetal Medicine

## 2021-07-25 ENCOUNTER — Other Ambulatory Visit: Payer: Self-pay

## 2021-07-25 ENCOUNTER — Ambulatory Visit: Payer: Medicaid Other | Admitting: *Deleted

## 2021-07-25 VITALS — BP 107/69 | HR 100

## 2021-07-25 DIAGNOSIS — O365930 Maternal care for other known or suspected poor fetal growth, third trimester, not applicable or unspecified: Secondary | ICD-10-CM

## 2021-07-25 DIAGNOSIS — O411230 Chorioamnionitis, third trimester, not applicable or unspecified: Secondary | ICD-10-CM | POA: Diagnosis present

## 2021-07-25 DIAGNOSIS — O365990 Maternal care for other known or suspected poor fetal growth, unspecified trimester, not applicable or unspecified: Secondary | ICD-10-CM | POA: Diagnosis present

## 2021-07-25 DIAGNOSIS — O365910 Maternal care for other known or suspected poor fetal growth, first trimester, not applicable or unspecified: Secondary | ICD-10-CM | POA: Diagnosis present

## 2021-07-25 DIAGNOSIS — O365911 Maternal care for other known or suspected poor fetal growth, first trimester, fetus 1: Secondary | ICD-10-CM

## 2021-07-25 DIAGNOSIS — D62 Acute posthemorrhagic anemia: Secondary | ICD-10-CM | POA: Diagnosis not present

## 2021-07-25 DIAGNOSIS — O9081 Anemia of the puerperium: Secondary | ICD-10-CM | POA: Diagnosis not present

## 2021-07-25 DIAGNOSIS — O9903 Anemia complicating the puerperium: Secondary | ICD-10-CM | POA: Diagnosis not present

## 2021-07-25 DIAGNOSIS — Z3A37 37 weeks gestation of pregnancy: Secondary | ICD-10-CM | POA: Insufficient documentation

## 2021-07-25 DIAGNOSIS — Z23 Encounter for immunization: Secondary | ICD-10-CM | POA: Diagnosis not present

## 2021-07-25 DIAGNOSIS — Z3491 Encounter for supervision of normal pregnancy, unspecified, first trimester: Secondary | ICD-10-CM | POA: Insufficient documentation

## 2021-07-25 DIAGNOSIS — Z8759 Personal history of other complications of pregnancy, childbirth and the puerperium: Secondary | ICD-10-CM | POA: Diagnosis present

## 2021-07-25 DIAGNOSIS — Z20822 Contact with and (suspected) exposure to covid-19: Secondary | ICD-10-CM | POA: Diagnosis present

## 2021-07-25 DIAGNOSIS — Z363 Encounter for antenatal screening for malformations: Secondary | ICD-10-CM | POA: Diagnosis not present

## 2021-07-25 DIAGNOSIS — O368390 Maternal care for abnormalities of the fetal heart rate or rhythm, unspecified trimester, not applicable or unspecified: Secondary | ICD-10-CM

## 2021-07-25 LAB — RESP PANEL BY RT-PCR (FLU A&B, COVID) ARPGX2
Influenza A by PCR: NEGATIVE
Influenza B by PCR: NEGATIVE
SARS Coronavirus 2 by RT PCR: NEGATIVE

## 2021-07-25 LAB — CBC
HCT: 30.9 % — ABNORMAL LOW (ref 36.0–46.0)
Hemoglobin: 10.2 g/dL — ABNORMAL LOW (ref 12.0–15.0)
MCH: 27.9 pg (ref 26.0–34.0)
MCHC: 33 g/dL (ref 30.0–36.0)
MCV: 84.7 fL (ref 80.0–100.0)
Platelets: 327 10*3/uL (ref 150–400)
RBC: 3.65 MIL/uL — ABNORMAL LOW (ref 3.87–5.11)
RDW: 14.8 % (ref 11.5–15.5)
WBC: 13.7 10*3/uL — ABNORMAL HIGH (ref 4.0–10.5)
nRBC: 0 % (ref 0.0–0.2)

## 2021-07-25 MED ORDER — ONDANSETRON HCL 4 MG/2ML IJ SOLN
4.0000 mg | Freq: Four times a day (QID) | INTRAMUSCULAR | Status: DC | PRN
  Administered 2021-07-26: 10:00:00 4 mg via INTRAVENOUS
  Filled 2021-07-25: qty 2

## 2021-07-25 MED ORDER — AMMONIA AROMATIC IN INHA
RESPIRATORY_TRACT | Status: AC
  Filled 2021-07-25: qty 10

## 2021-07-25 MED ORDER — LACTATED RINGERS IV SOLN
500.0000 mL | INTRAVENOUS | Status: DC | PRN
  Administered 2021-07-25 – 2021-07-26 (×2): 500 mL via INTRAVENOUS

## 2021-07-25 MED ORDER — OXYTOCIN-SODIUM CHLORIDE 30-0.9 UT/500ML-% IV SOLN
2.5000 [IU]/h | INTRAVENOUS | Status: DC
  Filled 2021-07-25: qty 500

## 2021-07-25 MED ORDER — LIDOCAINE HCL (PF) 1 % IJ SOLN: 30.0000 mL | INTRAMUSCULAR | Status: DC | PRN

## 2021-07-25 MED ORDER — MISOPROSTOL 25 MCG QUARTER TABLET
25.0000 ug | ORAL_TABLET | ORAL | Status: DC | PRN
  Administered 2021-07-25 – 2021-07-26 (×2): 25 ug via VAGINAL
  Filled 2021-07-25 (×2): qty 1

## 2021-07-25 MED ORDER — OXYCODONE-ACETAMINOPHEN 5-325 MG PO TABS: 1.0000 | ORAL_TABLET | ORAL | Status: DC | PRN

## 2021-07-25 MED ORDER — SOD CITRATE-CITRIC ACID 500-334 MG/5ML PO SOLN: 30.0000 mL | ORAL | Status: DC | PRN

## 2021-07-25 MED ORDER — OXYCODONE-ACETAMINOPHEN 5-325 MG PO TABS: 2.0000 | ORAL_TABLET | ORAL | Status: DC | PRN

## 2021-07-25 MED ORDER — LACTATED RINGERS IV SOLN: INTRAVENOUS | Status: DC

## 2021-07-25 MED ORDER — LIDOCAINE HCL (PF) 1 % IJ SOLN
INTRAMUSCULAR | Status: AC
  Filled 2021-07-25: qty 30

## 2021-07-25 MED ORDER — OXYTOCIN BOLUS FROM INFUSION: 333.0000 mL | Freq: Once | INTRAVENOUS | Status: DC

## 2021-07-25 MED ORDER — TERBUTALINE SULFATE 1 MG/ML IJ SOLN: 0.2500 mg | Freq: Once | INTRAMUSCULAR | Status: DC | PRN

## 2021-07-25 MED ORDER — ACETAMINOPHEN 325 MG PO TABS
650.0000 mg | ORAL_TABLET | ORAL | Status: DC | PRN
  Administered 2021-07-26: 20:00:00 650 mg via ORAL
  Filled 2021-07-25: qty 2

## 2021-07-25 MED ORDER — MISOPROSTOL 200 MCG PO TABS
ORAL_TABLET | ORAL | Status: AC
  Filled 2021-07-25: qty 4

## 2021-07-25 MED ORDER — OXYTOCIN 10 UNIT/ML IJ SOLN
INTRAMUSCULAR | Status: AC
  Filled 2021-07-25: qty 2

## 2021-07-25 NOTE — Progress Notes (Signed)
MFM Brief Note  Ms. Matherly is a 30 yo  G2P0 who is here at [place] 3 d at the request of [facility] regarding fetal growth restriction evaluation.  Prior exam demonstrated EFW at the 8th%.  We observed a single intrauterine pregnancy. Norma anatomy with measurements consistent with FGR at the 2.3% Good fetal movement and amniotic fluid volume  The UA Dopplers are normal without evidence of AEDF or REDF. BPP was 10/10 Suboptimal views of the fetal anatomy was observed secondary to fetal position and advanced gestational age.  I discussed today's visit with a diagnosis of FGR. I explained that the etiology includes placental insufficiency, chronic disease, infection, aneuploidy and other genetic syndromes. She has a low risk NIPS She has no additional risk factors for chronic disease. At this time I explained the diagnosis, evaluation and management, given the EFW of < 3rd% or abnormal testing, I recommend delivery . I called L&D and she plans to go there today after our visit.  All questions answered.  I spent 20 minutes with > 50% in face to face consulation.  Vikki Ports, MD

## 2021-07-25 NOTE — H&P (Signed)
Obstetrics Admission History & Physical   No chief complaint on file.   HPI:  30 y.o. G2P0010 @ [redacted]w[redacted]d (08/11/2021, by Last Menstrual Period). Admitted on 07/25/2021:   Patient Active Problem List   Diagnosis Date Noted   IUGR (intrauterine growth restriction) affecting care of mother 07/21/2021   History of ectopic pregnancy 12/19/2020   Supervision of low-risk pregnancy, first trimester 12/19/2020   Ectopic pregnancy of right ovary 03/10/2020   Ovarian cyst, left 03/10/2020   Pelvic pain 03/10/2020   Restless leg syndrome 10/02/2016   Sinus tachycardia 09/12/2015     Presents for findings c/w IUGR, with monitoring showing decreased EFW and today is<3%.  Good FM.  BPP 10/10 today.  Prior ectopic pregnancy, this is first baby..   Prenatal care at: at [facility]. Pregnancy complicated by none.  ROS: A review of systems was performed and negative, except as stated in the above HPI. PMHx:  Past Medical History:  Diagnosis Date   Abnormal Pap smear of cervix    all previous paps except for one in 2016   Anemia affecting pregnancy    Asthma    Ectopic pregnancy    Pelvic pain    Restless leg    PSHx:  Past Surgical History:  Procedure Laterality Date   APPENDECTOMY  2016   DIAGNOSTIC LAPAROSCOPY WITH REMOVAL OF ECTOPIC PREGNANCY N/A 03/10/2020   Procedure: DIAGNOSTIC LAPAROSCOPY WITH REMOVAL OF ECTOPIC PREGNANCY Right Salpingostomy Left Ovarian Cystectomy;  Surgeon: Gae Dry, MD;  Location: [facility] ORS;  Service: Gynecology;  Laterality: N/A;   ECTOPIC PREGNANCY SURGERY Right 2021   HERNIA REPAIR     Medications:  Medications Prior to Admission  Medication Sig Dispense Refill Last Dose   docusate sodium (COLACE) 100 MG capsule Take 1 capsule (100 mg total) by mouth 2 (two) times daily as needed. 60 capsule 3 07/24/2021   Prenatal Vit-Fe Fumarate-FA (PNV PRENATAL PLUS MULTIVITAMIN) 27-1 MG TABS Take 1 tablet by mouth every morning.   07/24/2021   ondansetron (ZOFRAN ODT) 4  MG disintegrating tablet Take 1 tablet (4 mg total) by mouth every 6 (six) hours as needed for nausea. (Patient not taking: Reported on 07/06/2021) 120 tablet 3 Not Taking   Allergies: is allergic to sulfa antibiotics. OBHx:  OB History  Gravida Para Term Preterm AB Living  2 0 0 0 1 0  SAB IAB Ectopic Multiple Live Births  0 0 1 0      # Outcome Date GA Lbr Len/2nd Weight Sex Delivery Anes PTL Lv  2 Current           1 Ectopic 02/24/20           YPP:[id number]/[id number] except as detailed in HPI.Marland Kitchen  No family history of birth defects. Soc Hx: Alcohol: none and Recreational drug use: none  Objective:   Vitals:   07/25/21 1826  BP: 109/65  Pulse: (!) 102  Resp: 18  Temp: 97.8 F (36.6 C)  SpO2: 100%   Constitutional: Well nourished, well developed female in no acute distress.  HEENT: normal Skin: Warm and dry.  Cardiovascular:Regular rate and rhythm.   Extremity: trace to 1+ bilateral pedal edema Respiratory: Clear to auscultation bilateral. Normal respiratory effort Abdomen: gravid, ND, FHT present, mild tenderness on exam Back: no CVAT Neuro: DTRs 2+, Cranial nerves grossly intact Psych: Alert and Oriented x3. No memory deficits. Normal mood and affect.  MS: normal gait, normal bilateral lower extremity ROM/strength/stability.  Pelvic exam: is not limited by body habitus  EGBUS: within normal limits Vagina: within normal limits and with normal mucosa Cervix: CERVIX: 1 cm dilated, 50 effaced, -3 station, presenting part Vtx Uterus: No contractions observed for 30 minutes.  Adnexa: not evaluated  EFM:FHR: 140 bpm, variability: moderate,  accelerations:  Present,  decelerations:  Absent Toco: None   Perinatal info:  Blood type: A positive Rubella- Immune Varicella -Immune TDaP tetanus status unknown to the patient RPR NR / HIV Neg/ HBsAg Neg   Assessment & Plan:   30 y.o. G2P0010 @ [redacted]w[redacted]d, Admitted on 07/25/2021:IOL for IUGR    Admit for labor, Fetal Wellbeing  Reassuring, Epidural when ready, and AROM when Appropriate  Cytotec planned  Barnett Applebaum, MD, [facility], [facility] 07/25/2021  6:39 PM

## 2021-07-25 NOTE — Procedures (Signed)
Emily Levy 04-01-1991 [redacted]w[redacted]d  Fetus A Non-Stress Test Interpretation for 07/25/21  Indication: IUGR  Fetal Heart Rate A Mode: External Baseline Rate (A): 140 bpm Variability: Moderate Accelerations: 15 x 15 Decelerations: None  Uterine Activity Mode: Toco Contraction Frequency (min): none Resting Tone Palpated: Relaxed  Interpretation (Fetal Testing) Nonstress Test Interpretation: Reactive Overall Impression: Reassuring for gestational age Comments: tracing reivewed by Dr. Gertie Exon

## 2021-07-25 NOTE — Addendum Note (Signed)
Addended by: Meryl Dare on: 07/25/2021 10:40 AM   Modules accepted: Orders

## 2021-07-26 ENCOUNTER — Inpatient Hospital Stay: Payer: Medicaid Other | Admitting: Anesthesiology

## 2021-07-26 ENCOUNTER — Encounter
Admission: AD | Disposition: A | Discharge status: Home / Self Care | Payer: Self-pay | Source: Ambulatory Visit | Attending: Obstetrics

## 2021-07-26 ENCOUNTER — Encounter: Payer: Self-pay | Admitting: Obstetrics & Gynecology

## 2021-07-26 DIAGNOSIS — O365930 Maternal care for other known or suspected poor fetal growth, third trimester, not applicable or unspecified: Principal | ICD-10-CM

## 2021-07-26 DIAGNOSIS — O368390 Maternal care for abnormalities of the fetal heart rate or rhythm, unspecified trimester, not applicable or unspecified: Secondary | ICD-10-CM

## 2021-07-26 DIAGNOSIS — O411230 Chorioamnionitis, third trimester, not applicable or unspecified: Secondary | ICD-10-CM

## 2021-07-26 DIAGNOSIS — O9903 Anemia complicating the puerperium: Secondary | ICD-10-CM

## 2021-07-26 DIAGNOSIS — D62 Acute posthemorrhagic anemia: Secondary | ICD-10-CM

## 2021-07-26 DIAGNOSIS — Z3A37 37 weeks gestation of pregnancy: Secondary | ICD-10-CM

## 2021-07-26 LAB — RPR: RPR Ser Ql: NONREACTIVE

## 2021-07-26 SURGERY — Surgical Case
Anesthesia: Epidural

## 2021-07-26 MED ORDER — NALOXONE HCL 0.4 MG/ML IJ SOLN: 0.4000 mg | INTRAMUSCULAR | Status: DC | PRN

## 2021-07-26 MED ORDER — BUPIVACAINE HCL (PF) 0.5 % IJ SOLN
INTRAMUSCULAR | Status: DC | PRN
  Administered 2021-07-26: 10 mL

## 2021-07-26 MED ORDER — KETOROLAC TROMETHAMINE 30 MG/ML IJ SOLN: 30.0000 mg | Freq: Four times a day (QID) | INTRAMUSCULAR | Status: AC | PRN

## 2021-07-26 MED ORDER — DIPHENHYDRAMINE HCL 25 MG PO CAPS: 25.0000 mg | ORAL_CAPSULE | Freq: Four times a day (QID) | ORAL | Status: DC | PRN

## 2021-07-26 MED ORDER — SCOPOLAMINE 1 MG/3DAYS TD PT72: 1.0000 | MEDICATED_PATCH | Freq: Once | TRANSDERMAL | Status: DC

## 2021-07-26 MED ORDER — SODIUM CHLORIDE 0.9 % IV SOLN
INTRAVENOUS | Status: AC
  Filled 2021-07-26: qty 2000

## 2021-07-26 MED ORDER — PHENYLEPHRINE 40 MCG/ML (10ML) SYRINGE FOR IV PUSH (FOR BLOOD PRESSURE SUPPORT): 80.0000 ug | PREFILLED_SYRINGE | INTRAVENOUS | Status: DC | PRN

## 2021-07-26 MED ORDER — CLINDAMYCIN PHOSPHATE 900 MG/50ML IV SOLN
900.0000 mg | INTRAVENOUS | Status: AC
  Administered 2021-07-26: 21:00:00 900 mg via INTRAVENOUS
  Filled 2021-07-26: qty 50

## 2021-07-26 MED ORDER — DIPHENHYDRAMINE HCL 50 MG/ML IJ SOLN
12.5000 mg | INTRAMUSCULAR | Status: DC | PRN
  Administered 2021-07-27: 01:00:00 12.5 mg via INTRAVENOUS
  Filled 2021-07-26: qty 1

## 2021-07-26 MED ORDER — FENTANYL-BUPIVACAINE-NACL 0.5-0.125-0.9 MG/250ML-% EP SOLN
EPIDURAL | Status: AC
  Filled 2021-07-26: qty 250

## 2021-07-26 MED ORDER — MENTHOL 3 MG MT LOZG
1.0000 | LOZENGE | OROMUCOSAL | Status: DC | PRN
  Filled 2021-07-26: qty 9

## 2021-07-26 MED ORDER — BUPIVACAINE HCL (PF) 0.5 % IJ SOLN
INTRAMUSCULAR | Status: AC
  Filled 2021-07-26: qty 30

## 2021-07-26 MED ORDER — NALOXONE HCL 4 MG/10ML IJ SOLN
1.0000 ug/kg/h | INTRAVENOUS | Status: DC | PRN
  Filled 2021-07-26: qty 5

## 2021-07-26 MED ORDER — FENTANYL-BUPIVACAINE-NACL 0.5-0.125-0.9 MG/250ML-% EP SOLN
12.0000 mL/h | EPIDURAL | Status: DC | PRN
  Administered 2021-07-26: 18:00:00 12 mL/h via EPIDURAL

## 2021-07-26 MED ORDER — SENNOSIDES-DOCUSATE SODIUM 8.6-50 MG PO TABS
2.0000 | ORAL_TABLET | ORAL | Status: DC
  Administered 2021-07-27 – 2021-07-29 (×3): 2 via ORAL
  Filled 2021-07-26 (×3): qty 2

## 2021-07-26 MED ORDER — SODIUM CHLORIDE 0.9 % IV SOLN
2.0000 g | Freq: Once | INTRAVENOUS | Status: AC
  Administered 2021-07-26: 21:00:00 2 g via INTRAVENOUS

## 2021-07-26 MED ORDER — SIMETHICONE 80 MG PO CHEW
80.0000 mg | CHEWABLE_TABLET | ORAL | Status: DC | PRN
  Administered 2021-07-27: 01:00:00 80 mg via ORAL
  Filled 2021-07-26: qty 1

## 2021-07-26 MED ORDER — GENTAMICIN SULFATE 40 MG/ML IJ SOLN
5.0000 mg/kg | INTRAVENOUS | Status: AC
  Administered 2021-07-26: 21:00:00 440 mg via INTRAVENOUS
  Filled 2021-07-26: qty 11

## 2021-07-26 MED ORDER — SOD CITRATE-CITRIC ACID 500-334 MG/5ML PO SOLN
ORAL | Status: AC
  Filled 2021-07-26: qty 15

## 2021-07-26 MED ORDER — PIPERACILLIN-TAZOBACTAM 3.375 G IVPB 30 MIN
3.3750 g | Freq: Three times a day (TID) | INTRAVENOUS | Status: DC
  Administered 2021-07-27: 02:00:00 3.375 g via INTRAVENOUS
  Filled 2021-07-26 (×3): qty 50

## 2021-07-26 MED ORDER — OXYCODONE HCL 5 MG PO TABS: 5.0000 mg | ORAL_TABLET | Freq: Four times a day (QID) | ORAL | Status: DC | PRN

## 2021-07-26 MED ORDER — WITCH HAZEL-GLYCERIN EX PADS: 1.0000 "application " | MEDICATED_PAD | CUTANEOUS | Status: DC | PRN

## 2021-07-26 MED ORDER — PHENYLEPHRINE 40 MCG/ML (10ML) SYRINGE FOR IV PUSH (FOR BLOOD PRESSURE SUPPORT)
PREFILLED_SYRINGE | INTRAVENOUS | Status: AC
  Filled 2021-07-26: qty 10

## 2021-07-26 MED ORDER — LIDOCAINE HCL (PF) 1 % IJ SOLN
INTRAMUSCULAR | Status: DC | PRN
  Administered 2021-07-26: 3 mL

## 2021-07-26 MED ORDER — EPHEDRINE 5 MG/ML INJ: 10.0000 mg | INTRAVENOUS | Status: DC | PRN

## 2021-07-26 MED ORDER — BISACODYL 10 MG RE SUPP
10.0000 mg | Freq: Every day | RECTAL | Status: DC | PRN
  Filled 2021-07-26: qty 1

## 2021-07-26 MED ORDER — ONDANSETRON HCL 4 MG/2ML IJ SOLN
INTRAMUSCULAR | Status: DC | PRN
  Administered 2021-07-26 (×2): 4 mg via INTRAVENOUS

## 2021-07-26 MED ORDER — GENTAMICIN SULFATE 40 MG/ML IJ SOLN
5.0000 mg/kg | INTRAVENOUS | Status: DC
  Filled 2021-07-26: qty 11

## 2021-07-26 MED ORDER — BUPIVACAINE ON-Q PAIN PUMP (FOR ORDER SET NO CHG)
INJECTION | Status: DC
  Filled 2021-07-26 (×2): qty 1

## 2021-07-26 MED ORDER — HYDROMORPHONE HCL 1 MG/ML IJ SOLN: 0.2000 mg | INTRAMUSCULAR | Status: DC | PRN

## 2021-07-26 MED ORDER — MISOPROSTOL 25 MCG QUARTER TABLET
25.0000 ug | ORAL_TABLET | Freq: Once | ORAL | Status: AC
Start: 2021-07-26 — End: 2021-07-26
  Administered 2021-07-26: 02:00:00 25 ug via ORAL
  Filled 2021-07-26: qty 1

## 2021-07-26 MED ORDER — SODIUM CHLORIDE 0.9 % IV SOLN
2.0000 g | Freq: Four times a day (QID) | INTRAVENOUS | Status: DC
  Filled 2021-07-26 (×2): qty 2000

## 2021-07-26 MED ORDER — DIBUCAINE (PERIANAL) 1 % EX OINT: 1.0000 "application " | TOPICAL_OINTMENT | CUTANEOUS | Status: DC | PRN

## 2021-07-26 MED ORDER — ACETAMINOPHEN 500 MG PO TABS
1000.0000 mg | ORAL_TABLET | Freq: Four times a day (QID) | ORAL | Status: DC
  Administered 2021-07-27 – 2021-07-29 (×10): 1000 mg via ORAL
  Filled 2021-07-26 (×10): qty 2

## 2021-07-26 MED ORDER — COCONUT OIL OIL
1.0000 "application " | TOPICAL_OIL | Status: DC | PRN
  Filled 2021-07-26 (×2): qty 120

## 2021-07-26 MED ORDER — DIPHENHYDRAMINE HCL 50 MG/ML IJ SOLN: 12.5000 mg | INTRAMUSCULAR | Status: DC | PRN

## 2021-07-26 MED ORDER — LACTATED RINGERS IV SOLN: INTRAVENOUS | Status: DC

## 2021-07-26 MED ORDER — BUPIVACAINE HCL (PF) 0.25 % IJ SOLN
INTRAMUSCULAR | Status: DC | PRN
  Administered 2021-07-26 (×2): 5 mL via EPIDURAL

## 2021-07-26 MED ORDER — PHENYLEPHRINE HCL (PRESSORS) 10 MG/ML IV SOLN
INTRAVENOUS | Status: DC | PRN
  Administered 2021-07-26: 160 ug via INTRAVENOUS
  Administered 2021-07-26: 80 ug via INTRAVENOUS
  Administered 2021-07-26: 160 ug via INTRAVENOUS

## 2021-07-26 MED ORDER — LACTATED RINGERS IV SOLN: 500.0000 mL | Freq: Once | INTRAVENOUS | Status: DC

## 2021-07-26 MED ORDER — OXYCODONE HCL 5 MG PO TABS
5.0000 mg | ORAL_TABLET | ORAL | Status: DC | PRN
  Administered 2021-07-28 – 2021-07-29 (×4): 5 mg via ORAL
  Filled 2021-07-26 (×4): qty 1

## 2021-07-26 MED ORDER — ZOLPIDEM TARTRATE 5 MG PO TABS: 5.0000 mg | ORAL_TABLET | Freq: Every evening | ORAL | Status: DC | PRN

## 2021-07-26 MED ORDER — ACETAMINOPHEN 500 MG PO TABS: 1000.0000 mg | ORAL_TABLET | Freq: Four times a day (QID) | ORAL | Status: DC

## 2021-07-26 MED ORDER — OXYTOCIN-SODIUM CHLORIDE 30-0.9 UT/500ML-% IV SOLN
1.0000 m[IU]/min | INTRAVENOUS | Status: DC
Start: 2021-07-26 — End: 2021-07-26
  Administered 2021-07-26: 21:00:00 600 [IU]/h via INTRAVENOUS
  Administered 2021-07-26: 12:00:00 2 m[IU]/min via INTRAVENOUS

## 2021-07-26 MED ORDER — OXYTOCIN-SODIUM CHLORIDE 30-0.9 UT/500ML-% IV SOLN
2.5000 [IU]/h | INTRAVENOUS | Status: AC
  Administered 2021-07-26 – 2021-07-27 (×2): 2.5 [IU]/h via INTRAVENOUS
  Filled 2021-07-26: qty 500

## 2021-07-26 MED ORDER — SIMETHICONE 80 MG PO CHEW
80.0000 mg | CHEWABLE_TABLET | Freq: Three times a day (TID) | ORAL | Status: DC
  Administered 2021-07-27 – 2021-07-29 (×7): 80 mg via ORAL
  Filled 2021-07-26 (×7): qty 1

## 2021-07-26 MED ORDER — ONDANSETRON HCL 4 MG/2ML IJ SOLN
INTRAMUSCULAR | Status: AC
  Filled 2021-07-26: qty 2

## 2021-07-26 MED ORDER — PHENYLEPHRINE 40 MCG/ML (10ML) SYRINGE FOR IV PUSH (FOR BLOOD PRESSURE SUPPORT)
80.0000 ug | PREFILLED_SYRINGE | INTRAVENOUS | Status: DC | PRN
  Administered 2021-07-26: 18:00:00 80 ug via INTRAVENOUS

## 2021-07-26 MED ORDER — METHYLPREDNISOLONE SODIUM SUCC 125 MG IJ SOLR
INTRAMUSCULAR | Status: AC
  Filled 2021-07-26: qty 2

## 2021-07-26 MED ORDER — MORPHINE SULFATE (PF) 0.5 MG/ML IJ SOLN
INTRAMUSCULAR | Status: DC | PRN
  Administered 2021-07-26: 3 mg via EPIDURAL

## 2021-07-26 MED ORDER — IBUPROFEN 600 MG PO TABS
600.0000 mg | ORAL_TABLET | Freq: Four times a day (QID) | ORAL | Status: DC
  Administered 2021-07-27 – 2021-07-29 (×11): 600 mg via ORAL
  Filled 2021-07-26 (×11): qty 1

## 2021-07-26 MED ORDER — DIPHENHYDRAMINE HCL 25 MG PO CAPS: 25.0000 mg | ORAL_CAPSULE | ORAL | Status: DC | PRN

## 2021-07-26 MED ORDER — METHYLERGONOVINE MALEATE 0.2 MG/ML IJ SOLN
INTRAMUSCULAR | Status: DC | PRN
  Administered 2021-07-26: .2 mg via INTRAMUSCULAR

## 2021-07-26 MED ORDER — PHENYLEPHRINE HCL-NACL 20-0.9 MG/250ML-% IV SOLN
INTRAVENOUS | Status: DC | PRN
Start: 2021-07-26 — End: 2021-07-26
  Administered 2021-07-26: 40 ug/min via INTRAVENOUS

## 2021-07-26 MED ORDER — PHENYLEPHRINE HCL-NACL 20-0.9 MG/250ML-% IV SOLN
INTRAVENOUS | Status: AC
  Filled 2021-07-26: qty 250

## 2021-07-26 MED ORDER — PRENATAL MULTIVITAMIN CH
1.0000 | ORAL_TABLET | Freq: Every day | ORAL | Status: DC
  Administered 2021-07-27 – 2021-07-29 (×3): 1 via ORAL
  Filled 2021-07-26 (×3): qty 1

## 2021-07-26 MED ORDER — MEPERIDINE HCL 25 MG/ML IJ SOLN: 6.2500 mg | INTRAMUSCULAR | Status: DC | PRN

## 2021-07-26 MED ORDER — ONDANSETRON HCL 4 MG/2ML IJ SOLN: 4.0000 mg | Freq: Three times a day (TID) | INTRAMUSCULAR | Status: DC | PRN

## 2021-07-26 MED ORDER — SODIUM CHLORIDE 0.9% FLUSH: 3.0000 mL | INTRAVENOUS | Status: DC | PRN

## 2021-07-26 MED ORDER — MORPHINE SULFATE (PF) 0.5 MG/ML IJ SOLN
INTRAMUSCULAR | Status: AC
  Filled 2021-07-26: qty 10

## 2021-07-26 MED ORDER — SOD CITRATE-CITRIC ACID 500-334 MG/5ML PO SOLN
30.0000 mL | ORAL | Status: AC
  Administered 2021-07-26: 21:00:00 30 mL via ORAL
  Filled 2021-07-26: qty 30

## 2021-07-26 MED ORDER — LIDOCAINE-EPINEPHRINE (PF) 1.5 %-1:200000 IJ SOLN
INTRAMUSCULAR | Status: DC | PRN
  Administered 2021-07-26: 3 mg via PERINEURAL

## 2021-07-26 MED ORDER — LIDOCAINE HCL (PF) 2 % IJ SOLN
INTRAMUSCULAR | Status: DC | PRN
  Administered 2021-07-26: 100 mg via INTRADERMAL
  Administered 2021-07-26: 200 mg via INTRADERMAL
  Administered 2021-07-26: 100 mg via INTRADERMAL

## 2021-07-26 MED ORDER — TERBUTALINE SULFATE 1 MG/ML IJ SOLN: 0.2500 mg | Freq: Once | INTRAMUSCULAR | Status: DC | PRN

## 2021-07-26 MED ORDER — FERROUS SULFATE 325 (65 FE) MG PO TABS
325.0000 mg | ORAL_TABLET | Freq: Two times a day (BID) | ORAL | Status: DC
  Administered 2021-07-27 – 2021-07-29 (×5): 325 mg via ORAL
  Filled 2021-07-26 (×5): qty 1

## 2021-07-26 MED ORDER — METHYLERGONOVINE MALEATE 0.2 MG/ML IJ SOLN
INTRAMUSCULAR | Status: AC
  Filled 2021-07-26: qty 1

## 2021-07-26 MED ORDER — FLEET ENEMA 7-19 GM/118ML RE ENEM: 1.0000 | ENEMA | Freq: Every day | RECTAL | Status: DC | PRN

## 2021-07-26 SURGICAL SUPPLY — 28 items
ADH SKN CLS APL DERMABOND .7 (GAUZE/BANDAGES/DRESSINGS) ×2
APL PRP STRL LF DISP 70% ISPRP (MISCELLANEOUS) ×2
CHLORAPREP W/TINT 26 (MISCELLANEOUS) ×4 IMPLANT
DERMABOND ADVANCED (GAUZE/BANDAGES/DRESSINGS) ×2
DERMABOND ADVANCED .7 DNX12 (GAUZE/BANDAGES/DRESSINGS) ×1 IMPLANT
DRSG OPSITE POSTOP 4X10 (GAUZE/BANDAGES/DRESSINGS) ×2 IMPLANT
ELECT CAUTERY BLADE 6.4 (BLADE) ×2 IMPLANT
ELECT REM PT RETURN 9FT ADLT (ELECTROSURGICAL) ×2
ELECTRODE REM PT RTRN 9FT ADLT (ELECTROSURGICAL) ×1 IMPLANT
GLOVE SURG UNDER POLY LF SZ6.5 (GLOVE) ×4 IMPLANT
GOWN STRL REUS W/ TWL LRG LVL3 (GOWN DISPOSABLE) ×1 IMPLANT
GOWN STRL REUS W/ TWL XL LVL3 (GOWN DISPOSABLE) ×2 IMPLANT
GOWN STRL REUS W/TWL LRG LVL3 (GOWN DISPOSABLE) ×2
GOWN STRL REUS W/TWL XL LVL3 (GOWN DISPOSABLE) ×4
MANIFOLD NEPTUNE II (INSTRUMENTS) ×2 IMPLANT
MAT PREVALON FULL STRYKER (MISCELLANEOUS) ×2 IMPLANT
NS IRRIG 1000ML POUR BTL (IV SOLUTION) ×2 IMPLANT
PACK C SECTION AR (MISCELLANEOUS) ×2 IMPLANT
PAD OB MATERNITY 4.3X12.25 (PERSONAL CARE ITEMS) ×2 IMPLANT
PAD PREP 24X41 OB/GYN DISP (PERSONAL CARE ITEMS) ×2 IMPLANT
PENCIL SMOKE EVACUATOR (MISCELLANEOUS) ×2 IMPLANT
SCRUB EXIDINE 4% CHG 4OZ (MISCELLANEOUS) ×2 IMPLANT
SUT MNCRL AB 4-0 PS2 18 (SUTURE) ×2 IMPLANT
SUT PLAIN 3-0 (SUTURE) ×2 IMPLANT
SUT VIC AB 0 CT1 36 (SUTURE) ×7 IMPLANT
SUT VIC AB 2-0 CT1 36 (SUTURE) ×2 IMPLANT
SYR 30ML LL (SYRINGE) ×4 IMPLANT
WATER STERILE IRR 500ML POUR (IV SOLUTION) ×2 IMPLANT

## 2021-07-26 NOTE — Progress Notes (Signed)
Emily Levy is a 30 y.o. G2P0010 at [redacted]w[redacted]d who continues with IOL for IUGR.  Subjective:has rested some, walked in the room occasionally. Reports that she can feel her contractions. They have been very difficulty to trace with the external monitor.   Objective: BP (!) 100/54    Pulse (!) 118    Temp 98.8 F (37.1 C) (Oral)    Resp 16    Ht 5\' 5"  (1.651 m)    Wt 88.5 kg    LMP 11/04/2020    SpO2 100%    BMI 32.45 kg/m  I/O last 3 completed shifts: In: 1817.8 [I.V.:1817.8] Out: -  No intake/output data recorded.  FHT:  FHR: 140 bpm, variability: moderate,  accelerations:  Present,  decelerations:  Present rare, nonreptetive variables noted UC:   irregular, every 3-6 minutes minutes SVE:   Dilation: 3.5 Effacement (%): 60 Station: -2 Exam by:: Lynett Fish, CNM  Labs: Lab Results  Component Value Date   WBC 13.7 (H) 07/25/2021   HGB 10.2 (L) 07/25/2021   HCT 30.9 (L) 07/25/2021   MCV 84.7 07/25/2021   PLT 327 07/25/2021    Assessment / Plan: Induction of labor due to IUGR,  progressing well on pitocin  Labor:  AROM performed at 1630- clear fluid seen. IUPC placed  Fetal Wellbeing:  Category II Pain Control:  Labor support without medications I/D:  n/a Anticipated MOD:  NSVD Careful explanation of the IUPC and the purpose for its use provided to patient andher family. Plced with ease. Will titrate pitocin to achieve MVUs to approximately 200. Recheck in several hours and PRN.  Imagene Riches 07/26/2021, 4:53 PM

## 2021-07-26 NOTE — Discharge Summary (Signed)
Postpartum Discharge Summary  Date of Service updated 07/29/2021    Patient Name: Emily Levy DOB: 02/11/1991 MRN: [id number]  Date of admission: 07/25/2021 Delivery date:07/26/2021  Delivering provider: Adrian Prows R  Date of discharge: 07/29/2021  Admitting diagnosis: IUGR (intrauterine growth restriction) affecting care of mother [O36.5990] Intrauterine pregnancy: [redacted]w[redacted]d    Secondary diagnosis:  Principal Problem:   IUGR (intrauterine growth restriction) affecting care of mother Active Problems:   Chorioamnionitis in third trimester   Non-reassuring electronic fetal monitoring tracing  Additional problems: Acute blood loss Anemia    Discharge diagnosis: Term Pregnancy Delivered                                              Post partum procedures:blood transfusion Augmentation: AROM, Pitocin, and IP Foley Complications: Intrauterine Inflammation or infection (Chorioamniotis)  Hospital course: Induction of Labor With Cesarean Section   30y.o. yo G2P1011 at 39w5das admitted to the hospital 07/25/2021 for induction of labor. Patient had a labor course significant for chorioamnionitis and non reassuring fetal status. The patient went for cesarean section due to Non-Reassuring FHR. Delivery details are as follows: Membrane Rupture Time/Date: 4:30 PM ,07/26/2021   Delivery Method:C-Section, Low Transverse  Details of operation can be found in separate operative Note.  Patient had an uncomplicated postpartum course. She is ambulating, tolerating a regular diet, passing flatus, and urinating well.  Patient is discharged home in stable condition on 07/29/21.      Newborn Data: Birth date:07/26/2021  Birth time:9:28 PM  Gender:Female  Living status:Living  Apgars:7 ,9  Weight:2410 g                                Magnesium Sulfate received: No BMZ received: No Rhophylac:No MMR:No T-DaP: ordered Flu: No Transfusion:Yes  Physical exam  Vitals:   07/28/21  1710 07/28/21 2042 07/28/21 2336 07/29/21 0838  BP: 96/62 104/71 97/65 110/78  Pulse: 91 93 94 81  Resp: '18 16 18 18  ' Temp: 98.6 F (37 C) 98.4 F (36.9 C) 98.1 F (36.7 C) 98.4 F (36.9 C)  TempSrc: Oral Oral Oral Oral  SpO2: 99% 98% 98% 97%  Weight:      Height:       General: alert, cooperative, and no distress Lochia: appropriate Uterine Fundus: firm Incision: Healing well with no significant drainage DVT Evaluation: No evidence of DVT seen on physical exam. Labs: Lab Results  Component Value Date   WBC 16.5 (H) 07/29/2021   HGB 9.0 (L) 07/29/2021   HCT 27.2 (L) 07/29/2021   MCV 84.5 07/29/2021   PLT 240 07/29/2021   CMP Latest Ref Rng & Units 07/27/2021  Glucose 70 - 99 mg/dL 103(H)  BUN 6 - 20 mg/dL 5(L)  Creatinine 0.44 - 1.00 mg/dL 0.72  Sodium 135 - 145 mmol/L 135  Potassium 3.5 - 5.1 mmol/L 3.3(L)  Chloride 98 - 111 mmol/L 106  CO2 22 - 32 mmol/L 22  Calcium 8.9 - 10.3 mg/dL 7.7(L)  Total Protein 6.5 - 8.1 g/dL -  Total Bilirubin 0.3 - 1.2 mg/dL -  Alkaline Phos 38 - 126 U/L -  AST 15 - 41 U/L -  ALT 0 - 44 U/L -   Edinburgh Score: Edinburgh Postnatal Depression Scale Screening Tool 07/27/2021  I  have been able to laugh and see the funny side of things. 0  I have looked forward with enjoyment to things. 0  I have blamed myself unnecessarily when things went wrong. 2  I have been anxious or worried for no good reason. 1  I have felt scared or panicky for no good reason. 0  Things have been getting on top of me. 1  I have been so unhappy that I have had difficulty sleeping. 0  I have felt sad or miserable. 0  I have been so unhappy that I have been crying. 0  The thought of harming myself has occurred to me. 0  Edinburgh Postnatal Depression Scale Total 4      After visit meds:  Allergies as of 07/29/2021       Reactions   Sulfa Antibiotics Hives        Medication List     TAKE these medications    acetaminophen 500 MG tablet Commonly  known as: TYLENOL Take 2 tablets (1,000 mg total) by mouth every 6 (six) hours.   docusate sodium 100 MG capsule Commonly known as: COLACE Take 1 capsule (100 mg total) by mouth 2 (two) times daily as needed.   ibuprofen 600 MG tablet Commonly known as: ADVIL Take 1 tablet (600 mg total) by mouth every 6 (six) hours.   ondansetron 4 MG disintegrating tablet Commonly known as: Zofran ODT Take 1 tablet (4 mg total) by mouth every 6 (six) hours as needed for nausea.   oxyCODONE 5 MG immediate release tablet Commonly known as: Oxy IR/ROXICODONE Take 1 tablet (5 mg total) by mouth every 6 (six) hours as needed for severe pain.   PNV Prenatal Plus Multivitamin 27-1 MG Tabs Take 1 tablet by mouth every morning.               Discharge Care Instructions  (From admission, onward)           Start     Ordered   07/29/21 0000  Discharge wound care:       Comments: SHOWER DAILY Wash incision gently with soap and water.  Call office with any drainage, redness, or firmness of the incision.   07/29/21 0921             Discharge home in stable condition Infant Feeding: Breast Infant Disposition:home with mother Discharge instruction: per After Visit Summary and Postpartum booklet. Activity: Advance as tolerated. Pelvic rest for 6 weeks.  Diet: routine diet Anticipated Birth Control: Unsure Postpartum Appointment:1 week Additional Postpartum F/U: Incision check in next 7 days Future Appointments: No future appointments.  Follow up Visit:  Follow-up Information     Donzel Romack, Stefanie Libel, MD. Schedule an appointment as soon as possible for a visit in 1 week(s).   Specialty: Obstetrics and Gynecology Why: For incision check Contact information: [address] [place] [address] [phone]                     07/29/2021 Homero Fellers, MD

## 2021-07-26 NOTE — Op Note (Signed)
Cesarean Section Procedure Note 07/26/21  Pre-operative Diagnosis:  1. Non-reassuring fetal status  2. Chorioamnionitis  3. IUGR Post-operative Diagnosis: same, delivered. Procedure: Primarty Low Transverse Cesarean Section  Surgeon: Adrian Prows MD   Assistant(s): Gigi Gin CNM - No other skilled surgical assistant available. Anesthesia: Epidural Estimated Blood Loss: 262 cc Complications: None; patient tolerated the procedure well.  Disposition: PACU - hemodynamically stable. Condition: stable   Findings: A female infant in the cephalic presentation. Amniotic fluid - clear  Birth weight:2410 grams Apgars of 7 and 9.  Intact placenta with a three-vessel cord. Grossly normal uterus, tubes and ovaries bilaterally.   No intraabdominal adhesions were noted.   Procedure Details    The patient was taken to operating room, identified as the correct patient and the procedure verified as C-Section Delivery. A time out was held and the above information confirmed. After induction of anesthesia, the patient was draped and prepped in the usual sterile manner. A Pfannenstiel incision was made and carried down through the subcutaneous tissue to the fascia. Fascial incision was made and extended transversely with the Mayo scissors. The fascia was separated from the underlying rectus tissue superiorly and inferiorly. The peritoneum was identified and entered bluntly. Peritoneal incision was extended longitudinally. A low transverse hysterotomy was made. The fetus was delivered atraumatically. The umbilical cord was clamped x2 and cut and the infant was handed to the awaiting pediatricians. The placenta was removed intact and appeared normal with a 3-vessel cord. The uterus was exteriorized and cleared of all clot and debris. The hysterotomy was closed with running sutures of 0 Vicryl suture.  A second imbricating layer was placed with the same suture. Excellent hemostasis was observed.  The  uterus was returned to the abdomen. The pelvis was inspected and again, excellent hemostasis was noted. Fibrillar was applied to the uterine incision. The peritoneum was closed with a running stitch of 2-0 Vicryl. The On Q Pain pump system was then placed.  Trocars were placed through the abdominal wall into the subfascial space and these were used to thread the silver soaker cathaters into place.The rectus muscles were inspected and were hemostatic. The rectus fascia was then reapproximated with running sutures of 0-vicryl, with careful placement not to incorporate the cathaters. Subcutaneous tissues are then irrigated with saline and hemostasis assured with the bovie. The subcutaneous fat was approximated with 3-0 plain and a running stitch.  The skin was closed with 4-0 monocryl suture in a subcuticular fashion followed by skin adhesive. The cathaters are flushed each with 5 mL of Bupivicaine and stabilized into place with dressing. Instrument, sponge, and needle counts were correct prior to the abdominal closure and at the conclusion of the case.  The patient tolerated the procedure well and was transferred to the recovery room in stable condition.   Gigi Gin CNM assisted with this case. This was a high level case requiring a Physicist, medical. No other assistant was readily available. She assisted with retraction of tissue and application of fundal pressure during the case.    Homero Fellers MD [facility], [facility] 07/26/21 10:43 PM

## 2021-07-26 NOTE — Progress Notes (Addendum)
Reviewed fetal tracing. Baseline: 160 bpm, minimal variability with early decelerations. Patient remote from delivery with chorioamnionitis. Will proceed with cesarean delivery.   Adrian Prows MD, [facility], [facility] 07/26/2021 8:53 PM

## 2021-07-26 NOTE — Progress Notes (Signed)
Emily Levy is a 30 y.o. G2P0010 at [redacted]w[redacted]d by ultrasound admitted for induction of labor due to Poor fetal growth.  Subjective:she wanted to eat breakfast. Denies any painful contractions. Has received doses of Cytotec. She has discussed foley ball placement, and now gives consent for  its use.   Objective: BP 113/65    Pulse (!) 118    Temp 98.8 F (37.1 C) (Oral)    Resp 16    Ht 5\' 5"  (1.651 m)    Wt 88.5 kg    LMP 11/04/2020    SpO2 100%    BMI 32.45 kg/m  I/O last 3 completed shifts: In: 1817.8 [I.V.:1817.8] Out: -  No intake/output data recorded.  FHT:  FHR: 135 bpm, variability: moderate,  accelerations:  Present,  decelerations:  Absent UC:   irregular, mild SVE:   Dilation: 2 Effacement (%): 50 Station: -2 Exam by:: Emily Searing RN  Labs: Lab Results  Component Value Date   WBC 13.7 (H) 07/25/2021   HGB 10.2 (L) 07/25/2021   HCT 30.9 (L) 07/25/2021   MCV 84.7 07/25/2021   PLT 327 07/25/2021    Assessment / Plan: IOL at 37 weeks 5 days for IUGR Started with Cytotec dosing. Now for placement of Cooke catheter  Labor:  Cervical ripening- now moving to foley ball and then pitocin.  Fetal Wellbeing:  Category I Pain Control:  Labor support without medications I/D:  n/a Anticipated MOD:  NSVD  At 10:30, a cooke catheter was easily placed in her cervix. Will start pitocin on an hour.  Emily Levy 07/26/2021, 11:12 AM

## 2021-07-26 NOTE — Progress Notes (Signed)
Emily Levy is a 30 y.o. G2P0010 at [redacted]w[redacted]d by  admitted for induction of labor who has developed a fever in the last hour.  Subjective: She is comfortable with epidural.    Objective: BP (!) 84/56 (BP Location: Right Arm)    Pulse (!) 118    Temp 99.5 F (37.5 C) (Oral)    Resp 18    Ht 5\' 5"  (1.651 m)    Wt 88.5 kg    LMP 11/04/2020    SpO2 100%    BMI 32.45 kg/m  I/O last 3 completed shifts: In: 1817.8 [I.V.:1817.8] Out: -  No intake/output data recorded.  FHT:  FHR: 165 baseline bpm, variability: minimal ,  accelerations:  Abscent,  decelerations:  Present with some early decels  and occasional lates that are more subtle UC:   hasve decreased in frequency- Pitocin now off SVE:   Dilation: 4 Effacement (%): 90 Station: -2 Exam by:: [employer] Harris-Courtney RN  Labs: Lab Results  Component Value Date   WBC 13.7 (H) 07/25/2021   HGB 10.2 (L) 07/25/2021   HCT 30.9 (L) 07/25/2021   MCV 84.7 07/25/2021   PLT 327 07/25/2021    Assessment / Plan: Chorioamnionitis at 4 cms dilated.   Remote from delivery    Fetal Wellbeing:  Category II Pain Control:  Epidural I/D:  n/a Anticipated MOD:   Dr. Gilman Schmidt contacted- She intends to expedite delivery via primary CS. Patient and her family now informed of the need to proceed with delivery via section. Transfer of care to MD management.   Imagene Riches 07/26/2021, 8:50 PM

## 2021-07-26 NOTE — Progress Notes (Signed)
Emily Levy is a 30 y.o. G2P0010 at [redacted]w[redacted]d by ultrasound admitted for induction of labor due to Poor fetal growth.  Subjective: She has been uncomfortable with the foley in place. Additional bloody show noted.   Objective: BP (!) 92/52    Pulse (!) 105    Temp 98.8 F (37.1 C) (Oral)    Resp 16    Ht 5\' 5"  (1.651 m)    Wt 88.5 kg    LMP 11/04/2020    SpO2 100%    BMI 32.45 kg/m  I/O last 3 completed shifts: In: 1817.8 [I.V.:1817.8] Out: -  No intake/output data recorded.  FHT:  FHR: 130 bpm, variability: moderate,  accelerations:  Present,  decelerations:  Present rare brief variable noted. UC:   difficult to trace secondary to patient moving. , irregular SVE:   Dilation: 3.5cms Effacement (%): 60% Station: -2 Exam by:: Robin Searing RN  Labs: Lab Results  Component Value Date   WBC 13.7 (H) 07/25/2021   HGB 10.2 (L) 07/25/2021   HCT 30.9 (L) 07/25/2021   MCV 84.7 07/25/2021   PLT 327 07/25/2021    Assessment / Plan: Induction of labor due to IUGR,  progressing well on pitocin  Labor:  now started on pitocin Preeclampsia:   Fetal Wellbeing:  Category II Pain Control:  Labor support without medications I/D:  n/a Anticipated MOD:  NSVD  Imagene Riches 07/26/2021, 2:18 PM

## 2021-07-26 NOTE — Progress Notes (Signed)
°  Labor Progress Note   30 y.o. G2P0010 @ [redacted]w[redacted]d , admitted for  Pregnancy, Labor Management.  IOL for IUGR  Subjective:  No pain Already frustrated with the waiting  Objective:  BP 113/65    Pulse (!) 118    Temp 98.8 F (37.1 C) (Oral)    Resp 16    Ht 5\' 5"  (1.651 m)    Wt 88.5 kg    LMP 11/04/2020    SpO2 100%    BMI 32.45 kg/m  Abd: gravid, ND, FHT present, without guarding, without rebound tenderness on exam Extr: trace to 1+ bilateral pedal edema SVE: 2/50/-2 at last check (0200)  EFM: FHR: 140 bpm, variability: moderate,  accelerations:  Present,  decelerations:  Absent (she has had a recent variable type decel Toco: None Labs: I have reviewed the patient's lab results.   Assessment & Plan:  G2P0010 @ [redacted]w[redacted]d, admitted for  Pregnancy and Labor/Delivery Management  1. Pain management: none. 2. FWB: FHT category 1.  3. ID: GBS negative 4. Labor management: Cytotec 2 rounds have been given Planing diet this am then start of Pitocin and potential foley bulb to cervix  All discussed with patient, see orders  Barnett Applebaum, MD, [facility] 07/26/2021  7:23 AM

## 2021-07-26 NOTE — Transfer of Care (Signed)
Immediate Anesthesia Transfer of Care Note  Patient: Emily Levy  Procedure(s) Performed: CESAREAN SECTION  Patient Location: PACU and Mother/Baby  Anesthesia Type:Epidural  Level of Consciousness: awake, alert  and oriented  Airway & Oxygen Therapy: Patient Spontanous Breathing  Post-op Assessment: Report given to RN and Post -op Vital signs reviewed and stable  Post vital signs: Reviewed and stable  Last Vitals:  Vitals Value Taken Time  BP 86/62 07/26/21 2239  Temp    Pulse    Resp 16 07/26/21 2239  SpO2 99 % 07/26/21 2239    Last Pain:  Vitals:   07/26/21 2049  TempSrc: Oral  PainSc:          Complications: No notable events documented.

## 2021-07-26 NOTE — Anesthesia Preprocedure Evaluation (Addendum)
Anesthesia Evaluation  Patient identified by MRN, date of birth, ID band Patient awake    Reviewed: Allergy & Precautions, NPO status , Patient's Chart, lab work & pertinent test results  Airway Mallampati: II  TM Distance: >3 FB Neck ROM: Full    Dental no notable dental hx.    Pulmonary asthma ,    Pulmonary exam normal breath sounds clear to auscultation       Cardiovascular negative cardio ROS Normal cardiovascular exam Rhythm:Regular Rate:Normal     Neuro/Psych negative neurological ROS  negative psych ROS   GI/Hepatic negative GI ROS, Neg liver ROS,   Endo/Other  negative endocrine ROS  Renal/GU negative Renal ROS  negative genitourinary   Musculoskeletal negative musculoskeletal ROS (+)   Abdominal   Peds negative pediatric ROS (+)  Hematology  (+) Blood dyscrasia, anemia ,   Anesthesia Other Findings Update: Epidural placed and functioning properly. However, pt was taken for a emergent CD for fetal intolerance of labor.   Reproductive/Obstetrics (+) Pregnancy                           Anesthesia Physical Anesthesia Plan  ASA: 2  Anesthesia Plan: Epidural   Post-op Pain Management:    Induction:   PONV Risk Score and Plan:   Airway Management Planned: Natural Airway  Additional Equipment:   Intra-op Plan:   Post-operative Plan:   Informed Consent: I have reviewed the patients History and Physical, chart, labs and discussed the procedure including the risks, benefits and alternatives for the proposed anesthesia with the patient or authorized representative who has indicated his/her understanding and acceptance.       Plan Discussed with: CRNA and Anesthesiologist  Anesthesia Plan Comments:        Anesthesia Quick Evaluation

## 2021-07-26 NOTE — Discharge Instructions (Signed)
Discharge Instructions:   Follow-up Appointment: 1 week, please call the office to schedule  If there are any new medications, they have been ordered and will be available for pickup at the listed pharmacy on your way home from the hospital.   Call the office if you have any of the following: headache, visual changes, fever >101.0 F, chills, shortness of breath, breast concerns, excessive vaginal bleeding, incision drainage or problems, leg pain or redness, depression or any other concerns. If you have vaginal discharge with an odor, let your doctor know.   It is normal to bleed for up to 6 weeks. You should not soak through more than 1 pad in 1 hour. If you have a blood clot larger than your fist with continued bleeding, call your doctor.   After a c-section, you should expect a small amount of blood or clear fluid coming from the incision and abdominal cramping/soreness. Inspect your incision site daily. Stand in front of a mirror to look for any redness, incision opening, or discolored/odorness drainage. Take a shower daily and continue good hygiene. Use own towel and washcloth (do not share). Make sure your sheets on your bed are clean. No pets sleeping around your incision site. Dressing will be removed at your postpartum visit. If the dressing does become wet or soiled underneath, it is okay to remove it before your visit.    On-Q pump: You will remove on day 4 after insertion or if the ball becomes flat before day 4. You will remove on: 07/30/2020  Activity: Do not lift > 15 lbs for 6 weeks (do not lift anything heavier than your baby). No intercourse, tampons, swimming pools, hot tubs, baths (only showers) for 6 weeks.  No driving for 1-2 weeks. Do not drive while taking narcotic or opioid pain medication.  Continue taking your prenatal vitamin, especially if breastfeeding. Increase calories and fluids (water) while breastfeeding.   Your milk will come in, in the next couple of days (right  now it is colostrum). You may have a slight fever when your milk comes in, but it should go away on its own.  If it does not, and rises above 101 F please call the doctor. You will also feel achy and your breasts will be firm. They will also start to leak. If you are breastfeeding, continue as you have been and you can pump/express milk for comfort.   If you have too much milk, your breasts can become engorged, which could lead to mastitis. This is an infection of the milk ducts. It can be very painful and you will need to notify your doctor to obtain a prescription for antibiotics. You can also treat it with a shower or hot/cold compress.   For concerns about your baby, please call your pediatrician.  For breastfeeding concerns, the lactation consultant can be reached at [phone].   Postpartum blues (feelings of happy one minute and sad another minute) are normal for the first few weeks but if it gets worse let your doctor know.   Congratulations! We enjoyed caring for you and your new bundle of joy!

## 2021-07-26 NOTE — Consult Note (Signed)
Pharmacy Antibiotic Note  Emily Levy is a 30 y.o. female admitted on 07/25/2021 with  Chorioamnionitis .  Pharmacy has been consulted for gentamicin dosing.  Plan: Pt developed intrapartum fever (pre-partum chorioamnionitis).   Continue with Amp 2g IV Q6H and Gentamicin 5mg /kg Q24h (ActBW) for now, but IF: Pt delivers vaginally and remains afebrile, rec to d/c all abx. If pt delivers caesarean or develops post-partum fever, then rec AMP/GENT and ADD clinda until afebrile and w/o s/sy of infxn for >24hrs (C-section) or >48hrs (post-partum fever)  CTM for oto- and nephrotoxicity. If Trough monitoring; goal <0.7mcg/mL   Height: 5\' 5"  (165.1 cm) Weight: 88.5 kg (195 lb) IBW/kg (Calculated) : 57  Temp (24hrs), Avg:99 F (37.2 C), Min:98.5 F (36.9 C), Max:100.7 F (38.2 C)  Recent Labs  Lab 07/25/21 1758  WBC 13.7*    CrCl cannot be calculated (Patient's most recent lab result is older than the maximum 21 days allowed.).    Allergies  Allergen Reactions   Sulfa Antibiotics Hives    Antimicrobials this admission: Amp/Gent (12/28 >>   Dose adjustments this admission: CTM as renal fxn available.  Microbiology results: No cultures collected  Thank you for allowing pharmacy to be a part of this patients care.  Lorna Dibble 07/26/2021 8:27 PM

## 2021-07-27 ENCOUNTER — Encounter: Payer: Self-pay | Admitting: Anesthesiology

## 2021-07-27 ENCOUNTER — Encounter: Payer: Self-pay | Admitting: Obstetrics and Gynecology

## 2021-07-27 ENCOUNTER — Encounter: Payer: Medicaid Other | Admitting: Licensed Practical Nurse

## 2021-07-27 LAB — CBC
HCT: 24.1 % — ABNORMAL LOW (ref 36.0–46.0)
Hemoglobin: 7.9 g/dL — ABNORMAL LOW (ref 12.0–15.0)
MCH: 27.1 pg (ref 26.0–34.0)
MCHC: 32.8 g/dL (ref 30.0–36.0)
MCV: 82.8 fL (ref 80.0–100.0)
Platelets: 244 10*3/uL (ref 150–400)
RBC: 2.91 MIL/uL — ABNORMAL LOW (ref 3.87–5.11)
RDW: 14.8 % (ref 11.5–15.5)
WBC: 16 10*3/uL — ABNORMAL HIGH (ref 4.0–10.5)
nRBC: 0 % (ref 0.0–0.2)

## 2021-07-27 LAB — BASIC METABOLIC PANEL
Anion gap: 7 (ref 5–15)
BUN: 5 mg/dL — ABNORMAL LOW (ref 6–20)
CO2: 22 mmol/L (ref 22–32)
Calcium: 7.7 mg/dL — ABNORMAL LOW (ref 8.9–10.3)
Chloride: 106 mmol/L (ref 98–111)
Creatinine, Ser: 0.72 mg/dL (ref 0.44–1.00)
GFR, Estimated: >60 mL/min (ref >60)
Glucose, Bld: 103 mg/dL — ABNORMAL HIGH (ref 70–99)
Potassium: 3.3 mmol/L — ABNORMAL LOW (ref 3.5–5.1)
Sodium: 135 mmol/L (ref 135–145)

## 2021-07-27 MED ORDER — NALBUPHINE HCL 10 MG/ML IJ SOLN
5.0000 mg | INTRAMUSCULAR | Status: DC | PRN
  Administered 2021-07-27 (×3): 5 mg via INTRAVENOUS
  Filled 2021-07-27 (×3): qty 1

## 2021-07-27 MED ORDER — SODIUM CHLORIDE 0.9 % IV SOLN: INTRAVENOUS | Status: DC | PRN

## 2021-07-27 NOTE — Anesthesia Post-op Follow-up Note (Signed)
°  Anesthesia Pain Follow-up Note  Patient: Emily Levy  Day #: 1  Date of Follow-up: 07/27/2021 Time: 7:38 AM  Last Vitals:  Vitals:   07/27/21 0600 07/27/21 0700  BP:    Pulse: 90 89  Resp:    Temp:    SpO2: 94% 95%    Level of Consciousness: alert  Pain: none   Side Effects:None  Catheter Site Exam:clean, dry, no drainage     Plan: Continue current therapy of postop epidural at surgeon's request  Caryl Asp

## 2021-07-27 NOTE — Progress Notes (Addendum)
Subjective:  No concerns. Pain well controlled on po analgesics.  Appropriate lochia.   Has not ambulated.  No N/V Objective:  Vital signs in last 24 hours: Temp:  [98.2 F (36.8 C)-100.7 F (38.2 C)] 98.2 F (36.8 C) (12/29 0805) Pulse Rate:  [89-154] 93 (12/29 0805) Resp:  [15-25] 18 (12/29 0805) BP: (84-204)/(49-137) 102/71 (12/29 0805) SpO2:  [94 %-100 %] 97 % (12/29 0805)    Intake/Output      12/28 0701 12/29 0700 12/29 0701 12/30 0700   I.V. (mL/kg) 661.9 (7.5)    IV Piggyback 211    Total Intake(mL/kg) 872.9 (9.9)    Urine (mL/kg/hr) 2475 (1.2)    Blood 970    Total Output 3445    Net -2572.1           General: NAD Pulmonary: no increased work of breathing Abdomen: non-distended, non-tender, fundus firm at level of umbilicus Incision: Extremities: no edema, no erythema, no tenderness  Results for orders placed or performed during the hospital encounter of 07/25/21 (from the past 72 hour(s))  Resp Panel by RT-PCR (Flu A&B, Covid) Nasopharyngeal Swab     Status: None   Collection Time: 07/25/21  5:58 PM   Specimen: Nasopharyngeal Swab; Nasopharyngeal(NP) swabs in vial transport medium  Result Value Ref Range   SARS Coronavirus 2 by RT PCR NEGATIVE NEGATIVE    Comment: (NOTE) SARS-CoV-2 target nucleic acids are NOT DETECTED.  The SARS-CoV-2 RNA is generally detectable in upper respiratory specimens during the acute phase of infection. The lowest concentration of SARS-CoV-2 viral copies this assay can detect is 138 copies/mL. A negative result does not preclude SARS-Cov-2 infection and should not be used as the sole basis for treatment or other patient management decisions. A negative result may occur with  improper specimen collection/handling, submission of specimen other than nasopharyngeal swab, presence of viral mutation(s) within the areas targeted by this assay, and inadequate number of viral copies(<138 copies/mL). A negative result must be combined  with clinical observations, patient history, and epidemiological information. The expected result is Negative.  Fact Sheet for Patients:  EntrepreneurPulse.com.au  Fact Sheet for Healthcare Providers:  IncredibleEmployment.be  This test is no t yet approved or cleared by the [place] FDA and  has been authorized for detection and/or diagnosis of SARS-CoV-2 by FDA under an Emergency Use Authorization (EUA). This EUA will remain  in effect (meaning this test can be used) for the duration of the COVID-19 declaration under Section 564(b)(1) of the Act, 21 U.S.C.section 360bbb-3(b)(1), unless the authorization is terminated  or revoked sooner.       Influenza A by PCR NEGATIVE NEGATIVE   Influenza B by PCR NEGATIVE NEGATIVE    Comment: (NOTE) The Xpert Xpress SARS-CoV-2/FLU/RSV plus assay is intended as an aid in the diagnosis of influenza from Nasopharyngeal swab specimens and should not be used as a sole basis for treatment. Nasal washings and aspirates are unacceptable for Xpert Xpress SARS-CoV-2/FLU/RSV testing.  Fact Sheet for Patients: EntrepreneurPulse.com.au  Fact Sheet for Healthcare Providers: IncredibleEmployment.be  This test is not yet approved or cleared by the [place] FDA and has been authorized for detection and/or diagnosis of SARS-CoV-2 by FDA under an Emergency Use Authorization (EUA). This EUA will remain in effect (meaning this test can be used) for the duration of the COVID-19 declaration under Section 564(b)(1) of the Act, 21 U.S.C. section 360bbb-3(b)(1), unless the authorization is terminated or revoked.  Performed at [facility], [address] [place], [place] [address]   CBC     Status: Abnormal   Collection Time: 07/25/21  5:58 PM  Result Value Ref Range   WBC 13.7 (H) 4.0 - 10.5 K/uL   RBC 3.65 (L) 3.87 - 5.11 MIL/uL   Hemoglobin 10.2 (L) 12.0 - 15.0  g/dL   HCT 30.9 (L) 36.0 - 46.0 %   MCV 84.7 80.0 - 100.0 fL   MCH 27.9 26.0 - 34.0 pg   MCHC 33.0 30.0 - 36.0 g/dL   RDW 14.8 11.5 - 15.5 %   Platelets 327 150 - 400 K/uL   nRBC 0.0 0.0 - 0.2 %    Comment: Performed at [facility], [address] [place], [place] [address]  Type and screen [place]     Status: None   Collection Time: 07/25/21  5:58 PM  Result Value Ref Range   ABO/RH(D) A POS    Antibody Screen NEG    Sample Expiration      07/28/2021,2359 Performed at [facility], [address] [place], [place] [address]   RPR     Status: None   Collection Time: 07/25/21  5:58 PM  Result Value Ref Range   RPR Ser Ql NON REACTIVE NON REACTIVE    Comment: Performed at [facility] [address] [place], [place] [address]  Blood gas, arterial     Status: Abnormal (Preliminary result)   Collection Time: 07/26/21  9:45 PM  Result Value Ref Range   FIO2 PENDING    pH, Arterial 7.32 (L) 7.350 - 7.450   pCO2 arterial 40 32.0 - 48.0 mmHg   pO2, Arterial PENDING 83.0 - 108.0 mmHg   Bicarbonate 20.6 20.0 - 28.0 mmol/L   Acid-base deficit 5.2 (H) 0.0 - 2.0 mmol/L   O2 Saturation PENDING %   Patient temperature 37.0    Collection site CORD BLOOD    Sample type VENOUS    Allens test (pass/fail) na PASS   Mechanical Rate BIPAP     Comment: Performed at [facility], [address] [place], [place] [address]  CBC     Status: Abnormal   Collection Time: 07/27/21  5:52 AM  Result Value Ref Range   WBC 16.0 (H) 4.0 - 10.5 K/uL   RBC 2.91 (L) 3.87 - 5.11 MIL/uL   Hemoglobin 7.9 (L) 12.0 - 15.0 g/dL   HCT 24.1 (L) 36.0 - 46.0 %   MCV 82.8 80.0 - 100.0 fL   MCH 27.1 26.0 - 34.0 pg   MCHC 32.8 30.0 - 36.0 g/dL   RDW 14.8 11.5 - 15.5 %   Platelets 244 150 - 400 K/uL   nRBC 0.0 0.0 - 0.2 %    Comment: Performed at [facility], [address] [place], [place] [address]  Basic metabolic panel     Status:  Abnormal   Collection Time: 07/27/21  5:52 AM  Result Value Ref Range   Sodium 135 135 - 145 mmol/L   Potassium 3.3 (L) 3.5 - 5.1 mmol/L   Chloride 106 98 - 111 mmol/L   CO2 22 22 - 32 mmol/L   Glucose, Bld 103 (H) 70 - 99 mg/dL    Comment: Glucose reference range applies only to samples taken after fasting for at least 8 hours.   BUN 5 (L) 6 - 20 mg/dL   Creatinine, Ser 0.72 0.44 - 1.00 mg/dL   Calcium 7.7 (L) 8.9 - 10.3 mg/dL  GFR, Estimated >60 >60 mL/min    Comment: (NOTE) Calculated using the CKD-EPI Creatinine Equation (2021)    Anion gap 7 5 - 15    Comment: Performed at [facility], [address] [place], [place] [address]    Immunization History  Administered Date(s) Administered   Tdap 07/15/2020    Assessment:   30 y.o. [id number] postoperativeday # 1 1LTCS fetal intolerance to labor, IUGR, and chorioamnionitis   Plan:  1) Acute blood loss anemia - hemodynamically stable and asymptomatic - po ferrous sulfate - repeat CBC in AM given drop in H&H  2) Blood Type --/--/A POS (12/27 1758) / Rubella 5.51 (06/23 1504) / Varicella Immune  3) TDAP status administer prior to discharge  4) Feeding plan breast  5)  Chorioamnionitis - stop Zosyn  6) Disposition anticipate discharge POD2-3  Emily Mood, MD, [place], [facility] 07/27/2021, 9:27 AM

## 2021-07-27 NOTE — Anesthesia Postprocedure Evaluation (Signed)
Anesthesia Post Note  Patient: Emily Levy  Procedure(s) Performed: [place]  Patient location during evaluation: Mother Baby Anesthesia Type: Epidural Level of consciousness: oriented and awake and alert Pain management: pain level controlled Vital Signs Assessment: post-procedure vital signs reviewed and stable Respiratory status: spontaneous breathing and respiratory function stable Cardiovascular status: blood pressure returned to baseline and stable Postop Assessment: no headache, no backache, no apparent nausea or vomiting and able to ambulate Anesthetic complications: no   No notable events documented.   Last Vitals:  Vitals:   07/27/21 0600 07/27/21 0700  BP:    Pulse: 90 89  Resp:    Temp:    SpO2: 94% 95%    Last Pain:  Vitals:   07/27/21 0615  TempSrc:   PainSc: 3                  Caryl Asp

## 2021-07-27 NOTE — Lactation Note (Signed)
This note was copied from a baby's chart. Lactation Consultation Note  Patient Name: Emily Levy GBMBO'M Date: 07/27/2021 Reason for consult: Initial assessment;Primapara;Early term 37-38.6wks;Infant < 6lbs Age:30 hours  Initial lactation visit. Mom is P1, C/s delivery 12 hours ago. Feedings documented since delivery.  Mom feeding baby with dad's support upon entry. Parents report feeding for almost 10 minutes. Baby in good alignment and position in football hold on R breast. Dad adjusted pillows and assisted with re-latch when baby came off breast. Mom reports no pain or discomfort at this time. We briefly discussed frequent feeding attempts, but its common baby won't feed each time or as effective each time. Encouraged skin to skin and hand expression for ongoing encouragement of establishing and building a milk supply.  Education given on feeding patterns of late pre-term infants, monitoring feeding/output for reassurance of adequate transfer, and possible option of pumping to assist with feedings/supplement and building supply.  Whiteboard updated with LC name/number; encouraged to call out with questions and for support as needed.  Maternal Data Has patient been taught Hand Expression?: Yes Does the patient have breastfeeding experience prior to this delivery?: No  Feeding Mother's Current Feeding Choice: Breast Milk  LATCH Score Latch: Repeated attempts needed to sustain latch, nipple held in mouth throughout feeding, stimulation needed to elicit sucking reflex.  Audible Swallowing: A few with stimulation  Type of Nipple: Everted at rest and after stimulation  Comfort (Breast/Nipple): Soft / non-tender  Hold (Positioning): No assistance needed to correctly position infant at breast.  LATCH Score: 8   Lactation Tools Discussed/Used    Interventions Interventions: Breast feeding basics reviewed;Hand express;Education  Discharge    Consult Status Consult  Status: Follow-up Date: 07/27/21 Follow-up type: In-patient    Lavonia Drafts 07/27/2021, 10:08 AM

## 2021-07-28 LAB — CBC
HCT: 23.6 % — ABNORMAL LOW (ref 36.0–46.0)
Hemoglobin: 7.7 g/dL — ABNORMAL LOW (ref 12.0–15.0)
MCH: 27.6 pg (ref 26.0–34.0)
MCHC: 32.6 g/dL (ref 30.0–36.0)
MCV: 84.6 fL (ref 80.0–100.0)
Platelets: 251 10*3/uL (ref 150–400)
RBC: 2.79 MIL/uL — ABNORMAL LOW (ref 3.87–5.11)
RDW: 15 % (ref 11.5–15.5)
WBC: 19.4 10*3/uL — ABNORMAL HIGH (ref 4.0–10.5)
nRBC: 0 % (ref 0.0–0.2)

## 2021-07-28 LAB — PREPARE RBC (CROSSMATCH)

## 2021-07-28 MED ORDER — SODIUM CHLORIDE 0.9% IV SOLUTION: Freq: Once | INTRAVENOUS | Status: AC

## 2021-07-28 NOTE — Lactation Note (Signed)
This note was copied from a baby's chart. Lactation Consultation Note  Patient Name: Emily Levy Date: 07/28/2021 Reason for consult: Follow-up assessment;Primapara;Early term 37-38.6wks Age:30 hours  Maternal Data Has patient been taught Hand Expression?: Yes Does the patient have breastfeeding experience prior to this delivery?: No  Feeding Mother's Current Feeding Choice: Breast Milk BAby showing feeding cues, has only been nursing on one breast per feed, mom encouraged to offer both breast, nipples sl flat and bruised, but baby able to latch to left breast with compression and shaping of tissue, lips flanged and chin pressure, mom states there is no pain, latched well to right breast also, lips flanged at breast   LATCH Score Latch: Grasps breast easily, tongue down, lips flanged, rhythmical sucking.  Audible Swallowing: Spontaneous and intermittent  Type of Nipple: Flat  Comfort (Breast/Nipple): Filling, red/small blisters or bruises, mild/mod discomfort  Hold (Positioning): Assistance needed to correctly position infant at breast and maintain latch.  LATCH Score: 7   Lactation Tools Discussed/Used  [place] [facility] name updated on white board, mom instructed in use of manual breast pump in symphony kit   Interventions Interventions: Breast feeding basics reviewed;Assisted with latch;Hand express;Breast compression;Adjust position;Support pillows;Coconut oil;Comfort gels;Education  Discharge Pump: Manual;DEBP (mom has a [place] pump, plans on getting electric pump from [facility], given breast pump kit with manual pump instructed) WIC Program: Yes Referral faxed to [place] [facility]  Consult Status Consult Status: PRN Date: 07/28/21 Follow-up type: In-patient    Ferol Luz 07/28/2021, 4:22 PM

## 2021-07-28 NOTE — Progress Notes (Signed)
Spoke with Dr. Georgianne Fick, MD about pts change in status. Pt complains of lightheadedness and dizziness after getting up with "a little" dizziness while in the bed. Instructed pt to call out when she needs to get up next. Vital signs stable, no new orders at this time.

## 2021-07-28 NOTE — Progress Notes (Signed)
Subjective: Postpartum Day 2: Cesarean Delivery Patient reports incisional pain, tolerating PO, + flatus, and no problems voiding.  She has had an episode of feeling lightheaded this morning when getting up to the bathroom. Is aware that her H and H are low.she denies any heavy vaginal bleeding. Would like some additional pain medication as her incision is uncomfortable.  Objective: Vital signs in last 24 hours: Temp:  [97.6 F (36.4 C)-98.4 F (36.9 C)] 97.9 F (36.6 C) (12/30 0740) Pulse Rate:  [80-113] 80 (12/30 0740) Resp:  [16-20] 18 (12/30 0740) BP: (91-106)/(62-77) 106/68 (12/30 0740) SpO2:  [94 %-100 %] 98 % (12/30 0740)  Physical Exam:  General: cooperative, fatigued, and no distress Lochia: appropriate Uterine Fundus: firm Incision: healing well, no significant drainage, some leaking of fluid from her On Q site. Will redress DVT Evaluation: No evidence of DVT seen on physical exam. Negative Homan's sign.  Recent Labs    07/27/21 0552 07/28/21 0449  HGB 7.9* 7.7*  HCT 24.1* 23.6*    Assessment/Plan: Status post Cesarean section. Postoperative course complicated by blood loss anemia.   Continue current care Will consider giving her a unit of pRBCs. Discussed this with patient. CBC tomorrow in the am. Roxicodone requested.Emily Levy 07/28/2021, 9:46 AM

## 2021-07-29 LAB — CBC WITH DIFFERENTIAL/PLATELET
Abs Immature Granulocytes: 0.22 10*3/uL — ABNORMAL HIGH (ref 0.00–0.07)
Basophils Absolute: 0 10*3/uL (ref 0.0–0.1)
Basophils Relative: 0 %
Eosinophils Absolute: 0.2 10*3/uL (ref 0.0–0.5)
Eosinophils Relative: 1 %
HCT: 27.2 % — ABNORMAL LOW (ref 36.0–46.0)
Hemoglobin: 9 g/dL — ABNORMAL LOW (ref 12.0–15.0)
Immature Granulocytes: 1 %
Lymphocytes Relative: 19 %
Lymphs Abs: 3.2 10*3/uL (ref 0.7–4.0)
MCH: 28 pg (ref 26.0–34.0)
MCHC: 33.1 g/dL (ref 30.0–36.0)
MCV: 84.5 fL (ref 80.0–100.0)
Monocytes Absolute: 1.3 10*3/uL — ABNORMAL HIGH (ref 0.1–1.0)
Monocytes Relative: 8 %
Neutro Abs: 11.6 10*3/uL — ABNORMAL HIGH (ref 1.7–7.7)
Neutrophils Relative %: 71 %
Platelets: 240 10*3/uL (ref 150–400)
RBC: 3.22 MIL/uL — ABNORMAL LOW (ref 3.87–5.11)
RDW: 15.2 % (ref 11.5–15.5)
WBC: 16.5 10*3/uL — ABNORMAL HIGH (ref 4.0–10.5)
nRBC: 0.1 % (ref 0.0–0.2)

## 2021-07-29 LAB — BPAM RBC
Blood Product Expiration Date: 202301252359
ISSUE DATE / TIME: 202212301120
Unit Type and Rh: 6200

## 2021-07-29 LAB — TYPE AND SCREEN
ABO/RH(D): A POS
Antibody Screen: NEGATIVE
Unit division: 0

## 2021-07-29 MED ORDER — OXYCODONE HCL 5 MG PO TABS: 5.0000 mg | ORAL_TABLET | Freq: Four times a day (QID) | ORAL | 0 refills | Status: DC | PRN

## 2021-07-29 MED ORDER — IBUPROFEN 600 MG PO TABS: 600.0000 mg | ORAL_TABLET | Freq: Four times a day (QID) | ORAL | 0 refills | Status: DC

## 2021-07-29 MED ORDER — TETANUS-DIPHTH-ACELL PERTUSSIS 5-2.5-18.5 LF-MCG/0.5 IM SUSY
0.5000 mL | PREFILLED_SYRINGE | Freq: Once | INTRAMUSCULAR | Status: AC
Start: 2021-07-29 — End: 2021-07-29
  Administered 2021-07-29: 0.5 mL via INTRAMUSCULAR

## 2021-07-29 MED ORDER — ACETAMINOPHEN 500 MG PO TABS: 1000.0000 mg | ORAL_TABLET | Freq: Four times a day (QID) | ORAL | 0 refills | Status: DC

## 2021-07-29 NOTE — Progress Notes (Signed)
Patient ID: Emily Levy, female   DOB: 06-07-1991, 30 y.o.   MRN: [id number]   Subjective:   She reports she is feeling well. She reports her pain is controlled. She is ambulating. She is tolerating a regular diet. She is passing flatus.   Objective:  Blood pressure 110/78, pulse 81, temperature 98.4 F (36.9 C), temperature source Oral, resp. rate 18, height 5\' 5"  (1.651 m), weight 88.5 kg, last menstrual period 11/04/2020, SpO2 97 %, unknown if currently breastfeeding.  General: NAD Pulmonary: no increased work of breathing Abdomen: non-distended, non-tender, fundus firm at level of umbilicus Incision: clean, dry, intact Extremities: no edema, no erythema, no tenderness  Results for orders placed or performed during the hospital encounter of 07/25/21 (from the past 72 hour(s))  Blood gas, arterial     Status: Abnormal (Preliminary result)   Collection Time: 07/26/21  9:45 PM  Result Value Ref Range   FIO2 PENDING    pH, Arterial 7.32 (L) 7.350 - 7.450   pCO2 arterial 40 32.0 - 48.0 mmHg   pO2, Arterial PENDING 83.0 - 108.0 mmHg   Bicarbonate 20.6 20.0 - 28.0 mmol/L   Acid-base deficit 5.2 (H) 0.0 - 2.0 mmol/L   O2 Saturation PENDING %   Patient temperature 37.0    Collection site CORD BLOOD    Sample type VENOUS    Allens test (pass/fail) na PASS   Mechanical Rate BIPAP     Comment: Performed at [facility], [address] [place], [place] [address]  CBC     Status: Abnormal   Collection Time: 07/27/21  5:52 AM  Result Value Ref Range   WBC 16.0 (H) 4.0 - 10.5 K/uL   RBC 2.91 (L) 3.87 - 5.11 MIL/uL   Hemoglobin 7.9 (L) 12.0 - 15.0 g/dL   HCT 24.1 (L) 36.0 - 46.0 %   MCV 82.8 80.0 - 100.0 fL   MCH 27.1 26.0 - 34.0 pg   MCHC 32.8 30.0 - 36.0 g/dL   RDW 14.8 11.5 - 15.5 %   Platelets 244 150 - 400 K/uL   nRBC 0.0 0.0 - 0.2 %    Comment: Performed at [facility], [address] [place], [place] [address]  Basic metabolic panel     Status:  Abnormal   Collection Time: 07/27/21  5:52 AM  Result Value Ref Range   Sodium 135 135 - 145 mmol/L   Potassium 3.3 (L) 3.5 - 5.1 mmol/L   Chloride 106 98 - 111 mmol/L   CO2 22 22 - 32 mmol/L   Glucose, Bld 103 (H) 70 - 99 mg/dL    Comment: Glucose reference range applies only to samples taken after fasting for at least 8 hours.   BUN 5 (L) 6 - 20 mg/dL   Creatinine, Ser 0.72 0.44 - 1.00 mg/dL   Calcium 7.7 (L) 8.9 - 10.3 mg/dL   GFR, Estimated >60 >60 mL/min    Comment: (NOTE) Calculated using the CKD-EPI Creatinine Equation (2021)    Anion gap 7 5 - 15    Comment: Performed at [facility], [address] [place], [place] [address]  CBC     Status: Abnormal   Collection Time: 07/28/21  4:49 AM  Result Value Ref Range   WBC 19.4 (H) 4.0 - 10.5 K/uL   RBC 2.79 (L) 3.87 - 5.11 MIL/uL   Hemoglobin 7.7 (L) 12.0 - 15.0 g/dL   HCT 23.6 (L) 36.0 - 46.0 %   MCV 84.6  80.0 - 100.0 fL   MCH 27.6 26.0 - 34.0 pg   MCHC 32.6 30.0 - 36.0 g/dL   RDW 15.0 11.5 - 15.5 %   Platelets 251 150 - 400 K/uL   nRBC 0.0 0.0 - 0.2 %    Comment: Performed at [facility], [address] [place], [place] [address]  Prepare RBC (crossmatch)     Status: None   Collection Time: 07/28/21 10:04 AM  Result Value Ref Range   Order Confirmation      ORDER PROCESSED BY BLOOD BANK Performed at [facility], [address] [place], [place] [address]   CBC with Differential/Platelet     Status: Abnormal   Collection Time: 07/29/21  6:47 AM  Result Value Ref Range   WBC 16.5 (H) 4.0 - 10.5 K/uL   RBC 3.22 (L) 3.87 - 5.11 MIL/uL   Hemoglobin 9.0 (L) 12.0 - 15.0 g/dL   HCT 27.2 (L) 36.0 - 46.0 %   MCV 84.5 80.0 - 100.0 fL   MCH 28.0 26.0 - 34.0 pg   MCHC 33.1 30.0 - 36.0 g/dL   RDW 15.2 11.5 - 15.5 %   Platelets 240 150 - 400 K/uL   nRBC 0.1 0.0 - 0.2 %   Neutrophils Relative % 71 %   Neutro Abs 11.6 (H) 1.7 - 7.7 K/uL   Lymphocytes Relative 19 %   Lymphs Abs 3.2 0.7 - 4.0  K/uL   Monocytes Relative 8 %   Monocytes Absolute 1.3 (H) 0.1 - 1.0 K/uL   Eosinophils Relative 1 %   Eosinophils Absolute 0.2 0.0 - 0.5 K/uL   Basophils Relative 0 %   Basophils Absolute 0.0 0.0 - 0.1 K/uL   Immature Granulocytes 1 %   Abs Immature Granulocytes 0.22 (H) 0.00 - 0.07 K/uL    Comment: Performed at [facility], [address] [place], [place] [address]     Assessment:   30 y.o. [phone] postoperativeday # 3   Plan:  1) Acute blood loss anemia - hemodynamically stable and asymptomatic - po ferrous sulfate  2) Blood Type --/--/A POS (12/27 1758)   3) Rubella 5.51 (06/23 1504) / Varicella Immune  4) TDAP status - ordered Immunization History  Administered Date(s) Administered   Tdap 07/15/2020    5) Feeding- Breast   6) Contraception - not discussed  7) Disposition - discharge home today  Adrian Prows MD, [place], [facility] 07/29/2021 9:19 AM

## 2021-07-29 NOTE — Lactation Note (Signed)
This note was copied from a baby's chart. Spoke with mother regarding infant's feeding pattern and weight loss. Infant at an 8.3% weight loss with no wet diapers since last night. Infant has had some long stretches between feedings as well. Reviewed need to feed every three to four hours due to his age and size and need to wake infant for feedings if needed. Mom has access to a [employer] double electric pump and so I encouraged mom to pump for 15 mins after feeding infant and any amount pumped be fed back to infant by bottle or syringe. Instructed parents to watch for wet diapers/stools and infant should have at least 3 wet diapers today. Instructed to call lactation with any questions or concerns regarding infant's feeding. Parents will call provider for appt on Tuesday, 1/3 when office opens.

## 2021-08-01 ENCOUNTER — Telehealth: Payer: Self-pay

## 2021-08-01 MED ORDER — ACETAMINOPHEN 500 MG PO TABS: 1000.0000 mg | ORAL_TABLET | Freq: Four times a day (QID) | ORAL | 0 refills | Status: AC

## 2021-08-01 MED ORDER — OXYCODONE-ACETAMINOPHEN 5-325 MG PO TABS: 1.0000 | ORAL_TABLET | ORAL | 0 refills | Status: AC | PRN

## 2021-08-01 MED ORDER — IBUPROFEN 600 MG PO TABS: 600.0000 mg | ORAL_TABLET | Freq: Four times a day (QID) | ORAL | 0 refills | Status: AC

## 2021-08-01 NOTE — Telephone Encounter (Signed)
done

## 2021-08-01 NOTE — Telephone Encounter (Signed)
Pt aware.

## 2021-08-01 NOTE — Telephone Encounter (Signed)
Pt calling; [facility] did not receive rxs; needs rxs to go to [facility] in [place].  [phone]

## 2021-08-02 ENCOUNTER — Other Ambulatory Visit: Payer: Self-pay

## 2021-08-02 ENCOUNTER — Ambulatory Visit (INDEPENDENT_AMBULATORY_CARE_PROVIDER_SITE_OTHER): Payer: Medicaid Other | Admitting: Obstetrics and Gynecology

## 2021-08-02 ENCOUNTER — Encounter: Payer: Self-pay | Admitting: Obstetrics and Gynecology

## 2021-08-02 NOTE — Progress Notes (Signed)
Postpartum Visit  Chief Complaint:  Chief Complaint  Patient presents with   Postpartum Care    History of Present Illness: Patient is a 31 y.o. G2P1011 presents for postpartum visit.  Date of delivery: 07/26/2021 Type of delivery: cesarean section  Breast Feeding:  yes Lochia: less flow than a normal period Post partum depression/anxiety noted:  no  Date of last PAP: 9/20/201  normal   Any problems since the delivery:  none  She reports she has been feeling well  Newborn Details:  SINGLETON :  1. Baby Gender:female.  Infant Status: Infant doing well at home with mother.   Review of Systems: ROS  Past Medical History:  Past Medical History:  Diagnosis Date   Abnormal Pap smear of cervix    all previous paps except for one in 2016   Anemia affecting pregnancy    Asthma    Ectopic pregnancy    Pelvic pain    Restless leg     Past Surgical History:  Past Surgical History:  Procedure Laterality Date   APPENDECTOMY  2016   CESAREAN SECTION  07/26/2021   Procedure: CESAREAN SECTION;  Surgeon: Homero Fellers, MD;  Location: [facility];  Service: Obstetrics;;   DIAGNOSTIC LAPAROSCOPY WITH REMOVAL OF ECTOPIC PREGNANCY N/A 03/10/2020   Procedure: DIAGNOSTIC LAPAROSCOPY WITH REMOVAL OF ECTOPIC PREGNANCY Right Salpingostomy Left Ovarian Cystectomy;  Surgeon: Gae Dry, MD;  Location: [facility];  Service: Gynecology;  Laterality: N/A;   ECTOPIC PREGNANCY SURGERY Right 2021   HERNIA REPAIR      Family History:  Family History  Problem Relation Age of Onset   Heart disease Maternal Grandmother     Social History:  Social History   Socioeconomic History   Marital status: Single    Spouse name: Not on file   Number of children: Not on file   Years of education: Not on file   Highest education level: Not on file  Occupational History   Not on file  Tobacco Use   Smoking status: Never   Smokeless tobacco: Never  Vaping Use   Vaping Use: Former    Substances: Nicotine  Substance and Sexual Activity   Alcohol use: No   Drug use: No   Sexual activity: Yes    Birth control/protection: None  Other Topics Concern   Not on file  Social History Narrative   Not on file   Social Determinants of Health   Financial Resource Strain: Not on file  Food Insecurity: Not on file  Transportation Needs: Not on file  Physical Activity: Not on file  Stress: Not on file  Social Connections: Not on file  Intimate Partner Violence: Not on file    Allergies:  Allergies  Allergen Reactions   Sulfa Antibiotics Hives    Medications: Prior to Admission medications   Medication Sig Start Date End Date Taking? Authorizing Provider  acetaminophen (TYLENOL) 500 MG tablet Take 2 tablets (1,000 mg total) by mouth every 6 (six) hours. 08/01/21   Gae Dry, MD  docusate sodium (COLACE) 100 MG capsule Take 1 capsule (100 mg total) by mouth 2 (two) times daily as needed. 01/19/21   Jolyne Laye, Stefanie Libel, MD  ibuprofen (ADVIL) 600 MG tablet Take 1 tablet (600 mg total) by mouth every 6 (six) hours. 08/01/21   Gae Dry, MD  ondansetron (ZOFRAN ODT) 4 MG disintegrating tablet Take 1 tablet (4 mg total) by mouth every 6 (six) hours as needed for nausea. Patient not  taking: Reported on 07/06/2021 01/19/21   Homero Fellers, MD  oxyCODONE-acetaminophen (PERCOCET/ROXICET) 5-325 MG tablet Take 1 tablet by mouth every 4 (four) hours as needed for severe pain. 08/01/21   Gae Dry, MD  Prenatal Vit-Fe Fumarate-FA (PNV PRENATAL PLUS MULTIVITAMIN) 27-1 MG TABS Take 1 tablet by mouth every morning.    [provider]    Physical Exam Vitals:  Vitals:   08/02/21 1544  BP: 115/70    Physical Exam Constitutional:      Appearance: Normal appearance. She is well-developed.  HENT:     Head: Normocephalic and atraumatic.  Eyes:     Extraocular Movements: Extraocular movements intact.     Pupils: Pupils are equal, round, and reactive to  light.  Neck:     Thyroid: No thyromegaly.  Cardiovascular:     Rate and Rhythm: Normal rate and regular rhythm.     Heart sounds: Normal heart sounds.  Pulmonary:     Effort: Pulmonary effort is normal.     Breath sounds: Normal breath sounds.  Abdominal:     General: Bowel sounds are normal. There is no distension.     Palpations: Abdomen is soft. There is no mass.     Comments: Incision is clean dry and intact  Musculoskeletal:     Cervical back: Neck supple.  Neurological:     Mental Status: She is alert and oriented to person, place, and time.  Skin:    General: Skin is warm and dry.  Psychiatric:        Behavior: Behavior normal.        Thought Content: Thought content normal.        Judgment: Judgment normal.  Vitals reviewed.    Assessment: 31 y.o. C3U1314 presenting for 6 week postpartum visit  Plan: Problem List Items Addressed This Visit   None Visit Diagnoses     Postpartum care and examination    -  Primary       1) Contraception-  not discussed  2)  Pap: up to date  3) Patient underwent screening for postpartum depression with no concerns noted.  - Follow up in 2-3 weeks   Adrian Prows MD, [place], [facility] 08/03/2021 4:22 AM

## 2021-08-02 NOTE — Patient Instructions (Signed)
Postpartum Care After Cesarean Delivery This sheet gives you information about how to care for yourself from the time you deliver your baby to up to 6-12 weeks after delivery (postpartum period). Your health care provider may also give you more specific instructions. If you have problems or questions, contact your health care provider. Follow these instructions at home: Medicines Take over-the-counter and prescription medicines only as told by your health care provider. If you were prescribed an antibiotic medicine, take it as told by your health care provider. Do not stop taking the antibiotic even if you start to feel better. Ask your health care provider if the medicine prescribed to you: Requires you to avoid driving or using heavy machinery. Can cause constipation. You may need to take actions to prevent or treat constipation, such as: Drink enough fluid to keep your urine pale yellow. Take over-the-counter or prescription medicines. Eat foods that are high in fiber, such as beans, whole grains, and fresh fruits and vegetables. Limit foods that are high in fat and processed sugars, such as fried or sweet foods. Activity Gradually return to your normal activities as told by your health care provider. Avoid activities that take a lot of effort and energy (are strenuous) until approved by your health care provider. Walking at a slow to moderate pace is usually safe. Ask your health care provider what activities are safe for you. Do not lift anything that is heavier than your baby or 10 lb (4.5 kg) as told by your health care provider. Do not vacuum, climb stairs, or drive a car for as long as told by your health care provider. If possible, have someone help you at home until you are able to do your usual activities yourself. Rest as much as possible. Try to rest or take naps while your baby is sleeping. Vaginal bleeding It is normal to have vaginal bleeding (lochia) after delivery. Wear a  sanitary pad to absorb vaginal bleeding and discharge. During the first week after delivery, the amount and appearance of lochia is often similar to a menstrual period. Over the next few weeks, it will gradually decrease to a dry, yellow-brown discharge. For most women, lochia stops completely by 4-6 weeks after delivery. Vaginal bleeding can vary from woman to woman. Change your sanitary pads frequently. Watch for any changes in your flow, such as: A sudden increase in volume. A change in color. Large blood clots. If you pass a blood clot, save it and call your health care provider to discuss. Do not flush blood clots down the toilet before you get instructions from your health care provider. Do not use tampons or douches until your health care provider says this is safe. If you are not breastfeeding, your period should return 6-8 weeks after delivery. If you are breastfeeding, your period may return anytime between 8 weeks after delivery and the time that you stop breastfeeding. Perineal care  If your C-section (Cesarean section) was unplanned, and you were allowed to labor and push before delivery, you may have pain, swelling, and discomfort of the tissue between your vaginal opening and your anus (perineum). You may also have an incision in the tissue (episiotomy) or the tissue may have torn during delivery. Follow these instructions as told by your health care provider: Keep your perineum clean and dry as told by your health care provider. Use medicated pads and pain-relieving sprays and creams as directed. If you have an episiotomy or vaginal tear, check the area every day for  signs of infection. Check for: Redness, swelling, or pain. Fluid or blood. Warmth. Pus or a bad smell. You may be given a squirt bottle to use instead of wiping to clean the perineum area after you go to the bathroom. As you start healing, you may use the squirt bottle before wiping yourself. Make sure to wipe  gently. To relieve pain caused by an episiotomy, vaginal tear, or hemorrhoids, try taking a warm sitz bath 2-3 times a day. A sitz bath is a warm water bath that is taken while you are sitting down. The water should only come up to your hips and should cover your buttocks. Breast care Within the first few days after delivery, your breasts may feel heavy, full, and uncomfortable (breast engorgement). You may also have milk leaking from your breasts. Your health care provider can suggest ways to help relieve breast discomfort. Breast engorgement should go away within a few days. If you are breastfeeding: Wear a bra that supports your breasts and fits you well. Keep your nipples clean and dry. Apply creams and ointments as told by your health care provider. You may need to use breast pads to absorb milk leakage. You may have uterine contractions every time you breastfeed for several weeks after delivery. Uterine contractions help your uterus return to its normal size. If you have any problems with breastfeeding, work with your health care provider or a Science writer. If you are not breastfeeding: Avoid touching your breasts as this can make your breasts produce more milk. Wear a well-fitting bra and use cold packs to help with swelling. Do not squeeze out (express) milk. This causes you to make more milk. Intimacy and sexuality Ask your health care provider when you can engage in sexual activity. This may depend on your: Risk of infection. Healing rate. Comfort and desire to engage in sexual activity. You are able to get pregnant after delivery, even if you have not had your period. If desired, talk with your health care provider about methods of family planning or birth control (contraception). Lifestyle Do not use any products that contain nicotine or tobacco, such as cigarettes, e-cigarettes, and chewing tobacco. If you need help quitting, ask your health care provider. Do not drink  alcohol, especially if you are breastfeeding. Eating and drinking  Drink enough fluid to keep your urine pale yellow. Eat high-fiber foods every day. These may help prevent or relieve constipation. High-fiber foods include: Whole grain cereals and breads. Brown rice. Beans. Fresh fruits and vegetables. Take your prenatal vitamins until your postpartum checkup or until your health care provider tells you it is okay to stop. General instructions Keep all follow-up visits for you and your baby as told by your health care provider. Most women visit their health care provider for a postpartum checkup within the first 3-6 weeks after delivery. Contact a health care provider if you: Feel unable to cope with the changes that a new baby brings to your life, and these feelings do not go away. Feel unusually sad or worried. Have breasts that are painful, hard, or turn red. Have a fever. Have trouble holding urine or keeping urine from leaking. Have little or no interest in activities you used to enjoy. Have not breastfed at all and you have not had a menstrual period for 12 weeks after delivery. Have stopped breastfeeding and you have not had a menstrual period for 12 weeks after you stopped breastfeeding. Have questions about caring for yourself or your baby. Pass  a blood clot from your vagina. Get help right away if you: Have chest pain. Have difficulty breathing. Have sudden, severe leg pain. Have severe pain or cramping in your abdomen. Bleed from your vagina so much that you fill more than one sanitary pad in one hour. Bleeding should not be heavier than your heaviest period. Develop a severe headache. Faint. Have blurred vision or spots in your vision. Have a bad-smelling vaginal discharge. Have thoughts about hurting yourself or your baby. If you ever feel like you may hurt yourself or others, or have thoughts about taking your own life, get help right away. You can go to your nearest  emergency department or call: Your local emergency services (911 in the [place] A suicide crisis helpline, such as the [place] at (740) 193-2147. This is open 24 hours a day. Summary The period of time from when you deliver your baby to up to 6-12 weeks after delivery is called the postpartum period. Gradually return to your normal activities as told by your health care provider. Keep all follow-up visits for you and your baby as told by your health care provider. This information is not intended to replace advice given to you by your health care provider. Make sure you discuss any questions you have with your health care provider. Document Revised: 03/05/2018 Document Reviewed: 03/05/2018 Elsevier Patient Education  [place].

## 2021-08-03 NOTE — Addendum Note (Signed)
Addended by: Vikki Ports on: 08/03/2021 01:58 PM   Modules accepted: Level of Service

## 2021-08-18 ENCOUNTER — Encounter: Payer: Self-pay | Admitting: Obstetrics and Gynecology

## 2021-08-18 ENCOUNTER — Ambulatory Visit (INDEPENDENT_AMBULATORY_CARE_PROVIDER_SITE_OTHER): Payer: Medicaid Other | Admitting: Obstetrics and Gynecology

## 2021-08-18 ENCOUNTER — Other Ambulatory Visit: Payer: Self-pay

## 2021-08-18 DIAGNOSIS — Z30011 Encounter for initial prescription of contraceptive pills: Secondary | ICD-10-CM

## 2021-08-18 MED ORDER — NORETHINDRONE 0.35 MG PO TABS: 1.0000 | ORAL_TABLET | Freq: Every day | ORAL | 11 refills | Status: DC

## 2021-08-18 NOTE — Progress Notes (Signed)
Postpartum Visit  Chief Complaint:  Chief Complaint  Patient presents with   Postpartum Care    History of Present Illness: Patient is a 31 y.o. G2P1011 presents for postpartum visit.  Date of delivery: 07/26/2021 Type of delivery: cesarean section   Breast Feeding:  yes Lochia: normal  Edinburgh Post-Partum Depression Score: 1  Date of last PAP: 04/18/2020 normal  She reports she feels pressure and heaviness in her lower abdomen. No fevers, no abnormal bleeding, no foul smelling discharge.  Newborn Details:  SINGLETON :  1. Infant Status: Infant doing well at home with mother.   Review of Systems: Review of Systems  Constitutional:  Negative for chills, fever, malaise/fatigue and weight loss.  HENT:  Negative for congestion, hearing loss and sinus pain.   Eyes:  Negative for blurred vision and double vision.  Respiratory:  Negative for cough, sputum production, shortness of breath and wheezing.   Cardiovascular:  Negative for chest pain, palpitations, orthopnea and leg swelling.  Gastrointestinal:  Negative for abdominal pain, constipation, diarrhea, nausea and vomiting.  Genitourinary:  Negative for dysuria, flank pain, frequency, hematuria and urgency.  Musculoskeletal:  Negative for back pain, falls and joint pain.  Skin:  Negative for itching and rash.  Neurological:  Negative for dizziness and headaches.  Psychiatric/Behavioral:  Negative for depression, substance abuse and suicidal ideas. The patient is not nervous/anxious.    Past Medical History:  Past Medical History:  Diagnosis Date   Abnormal Pap smear of cervix    all previous paps except for one in 2016   Anemia affecting pregnancy    Asthma    Ectopic pregnancy    Pelvic pain    Restless leg     Past Surgical History:  Past Surgical History:  Procedure Laterality Date   APPENDECTOMY  2016   CESAREAN SECTION  07/26/2021   Procedure: CESAREAN SECTION;  Surgeon: Homero Fellers, MD;   Location: [facility];  Service: Obstetrics;;   DIAGNOSTIC LAPAROSCOPY WITH REMOVAL OF ECTOPIC PREGNANCY N/A 03/10/2020   Procedure: DIAGNOSTIC LAPAROSCOPY WITH REMOVAL OF ECTOPIC PREGNANCY Right Salpingostomy Left Ovarian Cystectomy;  Surgeon: Gae Dry, MD;  Location: [facility];  Service: Gynecology;  Laterality: N/A;   ECTOPIC PREGNANCY SURGERY Right 2021   HERNIA REPAIR      Family History:  Family History  Problem Relation Age of Onset   Heart disease Maternal Grandmother     Social History:  Social History   Socioeconomic History   Marital status: Single    Spouse name: Not on file   Number of children: Not on file   Years of education: Not on file   Highest education level: Not on file  Occupational History   Not on file  Tobacco Use   Smoking status: Never   Smokeless tobacco: Never  Vaping Use   Vaping Use: Former   Substances: Nicotine  Substance and Sexual Activity   Alcohol use: No   Drug use: No   Sexual activity: Yes    Birth control/protection: None  Other Topics Concern   Not on file  Social History Narrative   Not on file   Social Determinants of Health   Financial Resource Strain: Not on file  Food Insecurity: Not on file  Transportation Needs: Not on file  Physical Activity: Not on file  Stress: Not on file  Social Connections: Not on file  Intimate Partner Violence: Not on file    Allergies:  Allergies  Allergen Reactions  Sulfa Antibiotics Hives    Medications: Prior to Admission medications   Medication Sig Start Date End Date Taking? Authorizing Provider  acetaminophen (TYLENOL) 500 MG tablet Take 2 tablets (1,000 mg total) by mouth every 6 (six) hours. 08/01/21  Yes Gae Dry, MD  docusate sodium (COLACE) 100 MG capsule Take 1 capsule (100 mg total) by mouth 2 (two) times daily as needed. 01/19/21  Yes Tynetta Bachmann R, MD  ibuprofen (ADVIL) 600 MG tablet Take 1 tablet (600 mg total) by mouth every 6 (six) hours.  08/01/21  Yes Gae Dry, MD  norethindrone (MICRONOR) 0.35 MG tablet Take 1 tablet (0.35 mg total) by mouth daily. 08/18/21  Yes Sloan Galentine R, MD  ondansetron (ZOFRAN ODT) 4 MG disintegrating tablet Take 1 tablet (4 mg total) by mouth every 6 (six) hours as needed for nausea. 01/19/21  Yes Petina Muraski R, MD  oxyCODONE-acetaminophen (PERCOCET/ROXICET) 5-325 MG tablet Take 1 tablet by mouth every 4 (four) hours as needed for severe pain. 08/01/21  Yes Gae Dry, MD  Prenatal Vit-Fe Fumarate-FA (PNV PRENATAL PLUS MULTIVITAMIN) 27-1 MG TABS Take 1 tablet by mouth every morning.   Yes [provider]    Physical Exam Vitals:  Vitals:   08/18/21 1542  BP: 118/70    Physical Exam Constitutional:      Appearance: Normal appearance. She is well-developed.  HENT:     Head: Normocephalic and atraumatic.  Eyes:     Extraocular Movements: Extraocular movements intact.     Pupils: Pupils are equal, round, and reactive to light.  Neck:     Thyroid: No thyromegaly.  Cardiovascular:     Rate and Rhythm: Normal rate and regular rhythm.     Heart sounds: Normal heart sounds.  Pulmonary:     Effort: Pulmonary effort is normal.     Breath sounds: Normal breath sounds.  Abdominal:     General: Bowel sounds are normal. There is no distension.     Palpations: Abdomen is soft. There is no mass.     Comments: Incision is well healed, clean, dry and intact.  Musculoskeletal:     Cervical back: Neck supple.  Neurological:     Mental Status: She is alert and oriented to person, place, and time.  Skin:    General: Skin is warm and dry.  Psychiatric:        Behavior: Behavior normal.        Thought Content: Thought content normal.        Judgment: Judgment normal.  Vitals reviewed.    Assessment: 31 y.o. O2V0350 presenting for 6 week postpartum visit  Plan: Problem List Items Addressed This Visit   None Visit Diagnoses     Postpartum care and examination    -   Primary   Encounter for BCP (birth control pills) initial prescription       Relevant Medications   norethindrone (MICRONOR) 0.35 MG tablet       1) Contraception-  POP, rx sent  2)  Pap: up to date  3) Patient underwent screening for postpartum depression with no concerns noted.    - Follow up in 3 weeks   Adrian Prows MD, [place], [facility] 08/18/2021 4:06 PM

## 2021-08-23 LAB — BLOOD GAS, ARTERIAL
Acid-base deficit: 5.2 mmol/L — ABNORMAL HIGH (ref 0.0–2.0)
Bicarbonate: 20.6 mmol/L (ref 20.0–28.0)
Patient temperature: 37
pCO2 arterial: 40 mmHg (ref 32.0–48.0)
pH, Arterial: 7.32 — ABNORMAL LOW (ref 7.350–7.450)

## 2021-08-25 ENCOUNTER — Telehealth: Payer: Self-pay

## 2021-08-25 DIAGNOSIS — Z30011 Encounter for initial prescription of contraceptive pills: Secondary | ICD-10-CM

## 2021-08-25 MED ORDER — NORETHINDRONE 0.35 MG PO TABS: 1.0000 | ORAL_TABLET | Freq: Every day | ORAL | 11 refills | Status: AC

## 2021-08-25 NOTE — Telephone Encounter (Signed)
Pt calling; bc rx was sent to [facility]; she needs it sent to [facility] instead.  [phone] Pt aware [facility] [place].

## 2021-08-31 ENCOUNTER — Other Ambulatory Visit: Payer: Self-pay | Admitting: Obstetrics & Gynecology

## 2021-09-01 ENCOUNTER — Encounter: Payer: Self-pay | Admitting: Obstetrics and Gynecology

## 2021-09-13 ENCOUNTER — Encounter: Payer: Self-pay | Admitting: Obstetrics and Gynecology

## 2021-09-13 ENCOUNTER — Other Ambulatory Visit: Payer: Self-pay

## 2021-09-13 ENCOUNTER — Ambulatory Visit (INDEPENDENT_AMBULATORY_CARE_PROVIDER_SITE_OTHER): Payer: Medicaid Other | Admitting: Obstetrics and Gynecology

## 2021-09-13 NOTE — Progress Notes (Signed)
Postpartum Visit  Chief Complaint:  Chief Complaint  Patient presents with   Postpartum Care    History of Present Illness: Patient is a 31 y.o. G2P1011 presents for postpartum visit.  Date of delivery: 07/26/2021 Type of delivery: cesarean section   Breast Feeding:  yes Lochia: none Edinburgh Post-Partum Depression Score: 0  Date of last PAP: 04/18/2020   She reports she has been feeling well. Her infant is doing well at home.  Newborn Details:  SINGLETON :  1. Infant Status: Infant doing well at home with mother.   Review of Systems: Review of Systems  Constitutional:  Negative for chills, fever, malaise/fatigue and weight loss.  HENT:  Negative for congestion, hearing loss and sinus pain.   Eyes:  Negative for blurred vision and double vision.  Respiratory:  Negative for cough, sputum production, shortness of breath and wheezing.   Cardiovascular:  Negative for chest pain, palpitations, orthopnea and leg swelling.  Gastrointestinal:  Negative for abdominal pain, constipation, diarrhea, nausea and vomiting.  Genitourinary:  Negative for dysuria, flank pain, frequency, hematuria and urgency.  Musculoskeletal:  Negative for back pain, falls and joint pain.  Skin:  Negative for itching and rash.  Neurological:  Negative for dizziness and headaches.  Psychiatric/Behavioral:  Negative for depression, substance abuse and suicidal ideas. The patient is not nervous/anxious.    Past Medical History:  Past Medical History:  Diagnosis Date   Abnormal Pap smear of cervix    all previous paps except for one in 2016   Anemia affecting pregnancy    Asthma    Ectopic pregnancy    Pelvic pain    Restless leg     Past Surgical History:  Past Surgical History:  Procedure Laterality Date   APPENDECTOMY  2016   CESAREAN SECTION  07/26/2021   Procedure: CESAREAN SECTION;  Surgeon: Homero Fellers, MD;  Location: [facility] OR[facility];  Service: Obstetrics;;   DIAGNOSTIC LAPAROSCOPY  WITH REMOVAL OF ECTOPIC PREGNANCY N/A 03/10/2020   Procedure: DIAGNOSTIC LAPAROSCOPY WITH REMOVAL OF ECTOPIC PREGNANCY Right Salpingostomy Left Ovarian Cystectomy;  Surgeon: Gae Dry, MD;  Location: [facility] OR[facility];  Service: Gynecology;  Laterality: N/A;   ECTOPIC PREGNANCY SURGERY Right 2021   HERNIA REPAIR      Family History:  Family History  Problem Relation Age of Onset   Heart disease Maternal Grandmother     Social History:  Social History   Socioeconomic History   Marital status: Single    Spouse name: Not on file   Number of children: Not on file   Years of education: Not on file   Highest education level: Not on file  Occupational History   Not on file  Tobacco Use   Smoking status: Never   Smokeless tobacco: Never  Vaping Use   Vaping Use: Former   Substances: Nicotine  Substance and Sexual Activity   Alcohol use: No   Drug use: No   Sexual activity: Yes    Birth control/protection: None  Other Topics Concern   Not on file  Social History Narrative   Not on file   Social Determinants of Health   Financial Resource Strain: Not on file  Food Insecurity: Not on file  Transportation Needs: Not on file  Physical Activity: Not on file  Stress: Not on file  Social Connections: Not on file  Intimate Partner Violence: Not on file    Allergies:  Allergies  Allergen Reactions   Sulfa Antibiotics Hives    Medications:  Prior to Admission medications   Medication Sig Start Date End Date Taking? Authorizing Provider  acetaminophen (TYLENOL) 500 MG tablet Take 2 tablets (1,000 mg total) by mouth every 6 (six) hours. 08/01/21  Yes Gae Dry, MD  norethindrone (MICRONOR) 0.35 MG tablet Take 1 tablet (0.35 mg total) by mouth daily. 08/25/21  Yes Quame Spratlin, Stefanie Libel, MD  Prenatal Vit-Fe Fumarate-FA (PNV PRENATAL PLUS MULTIVITAMIN) 27-1 MG TABS Take 1 tablet by mouth every morning.   Yes [provider]  docusate sodium (COLACE) 100 MG capsule Take  1 capsule (100 mg total) by mouth 2 (two) times daily as needed. Patient not taking: Reported on 09/13/2021 01/19/21   Homero Fellers, MD  ibuprofen (ADVIL) 600 MG tablet Take 1 tablet (600 mg total) by mouth every 6 (six) hours. Patient not taking: Reported on 09/13/2021 08/01/21   Gae Dry, MD  ondansetron (ZOFRAN ODT) 4 MG disintegrating tablet Take 1 tablet (4 mg total) by mouth every 6 (six) hours as needed for nausea. Patient not taking: Reported on 09/13/2021 01/19/21   Homero Fellers, MD  oxyCODONE-acetaminophen (PERCOCET/ROXICET) 5-325 MG tablet Take 1 tablet by mouth every 4 (four) hours as needed for severe pain. Patient not taking: Reported on 09/13/2021 08/01/21   Gae Dry, MD    Physical Exam Vitals:  Vitals:   09/13/21 1409  BP: 112/72    Physical Exam Constitutional:      Appearance: Normal appearance. She is well-developed.  HENT:     Head: Normocephalic and atraumatic.  Eyes:     Extraocular Movements: Extraocular movements intact.     Pupils: Pupils are equal, round, and reactive to light.  Neck:     Thyroid: No thyromegaly.  Cardiovascular:     Rate and Rhythm: Normal rate and regular rhythm.     Heart sounds: Normal heart sounds.  Pulmonary:     Effort: Pulmonary effort is normal.     Breath sounds: Normal breath sounds.  Abdominal:     General: Bowel sounds are normal. There is no distension.     Palpations: Abdomen is soft. There is no mass.     Comments: Incisions fully healed, clean, dry, intact  Musculoskeletal:     Cervical back: Neck supple.  Neurological:     Mental Status: She is alert and oriented to person, place, and time.  Skin:    General: Skin is warm and dry.  Psychiatric:        Behavior: Behavior normal.        Thought Content: Thought content normal.        Judgment: Judgment normal.  Vitals reviewed.    Assessment: 31 y.o. G2P1011 presenting for 6 week postpartum visit  Plan: Problem List Items Addressed  This Visit   None Visit Diagnoses     Postpartum care and examination    -  Primary       1) Contraception-  POP, planning to start soon  2)  Pap: up to date  3) Patient underwent screening for postpartum depression with no concerns noted.  - Follow up as needed   Adrian Prows MD, [employer], [facility] 09/13/2021 2:26 PM

## 2022-05-24 IMAGING — US US OB FOLLOW-UP
1 series · 15 of 28 positions shown · non-contrast
Comparison: none

CLINICAL DATA: Measuring small for dates. Evaluate fetal growth and
amniotic fluid.

EXAM:
OBSTETRIC 14+ WK ULTRASOUND FOLLOW-UP

[Series 1: us ob follow up · 15 of 47 slices shown]
[im 1/47]
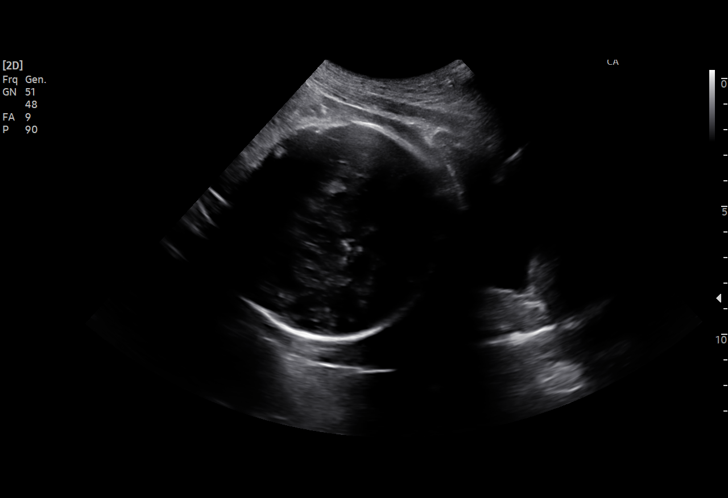
[im 4/47]
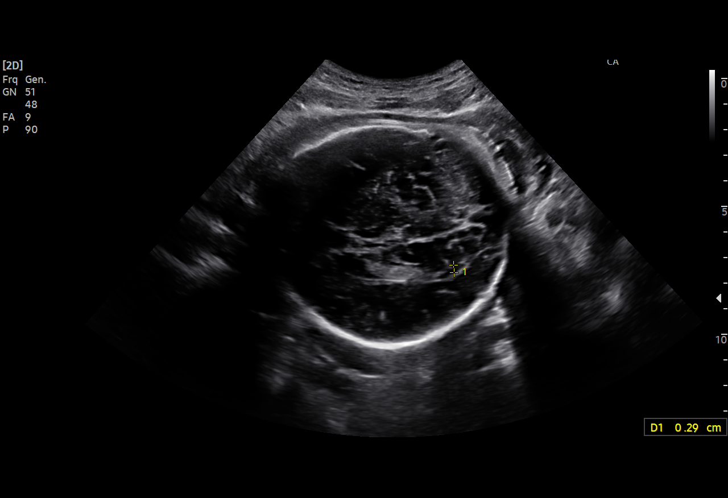
[im 7/47]
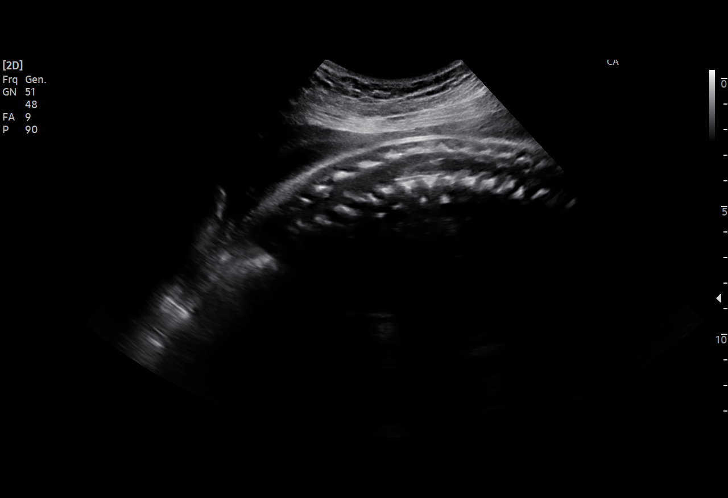
[im 11/47]
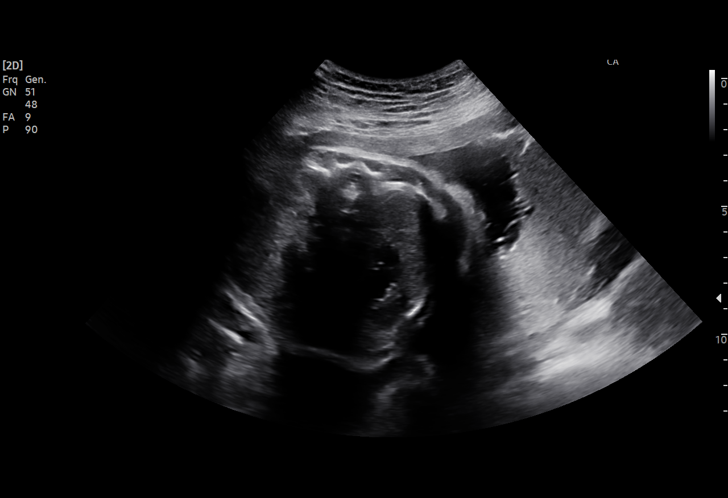
[im 14/47]
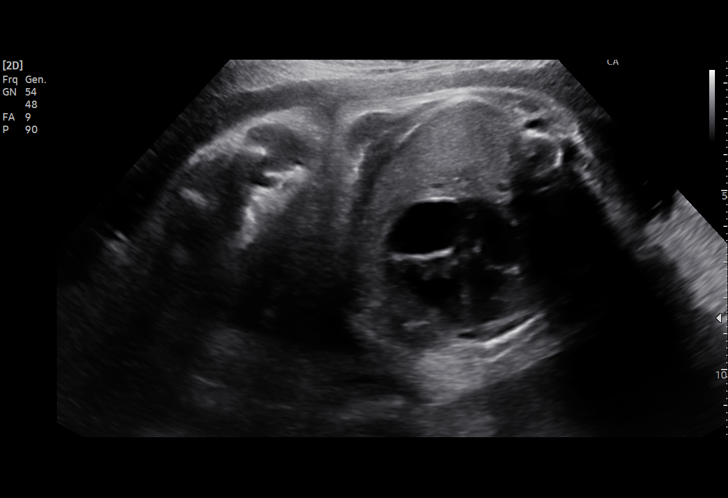
[im 18/47]
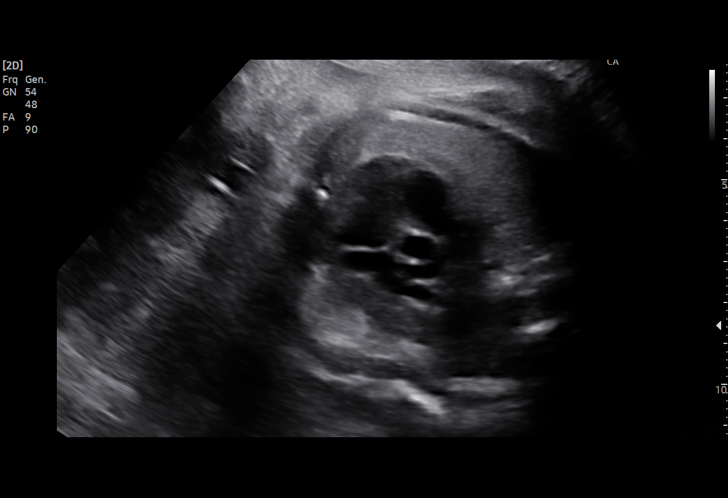
[im 21/47]
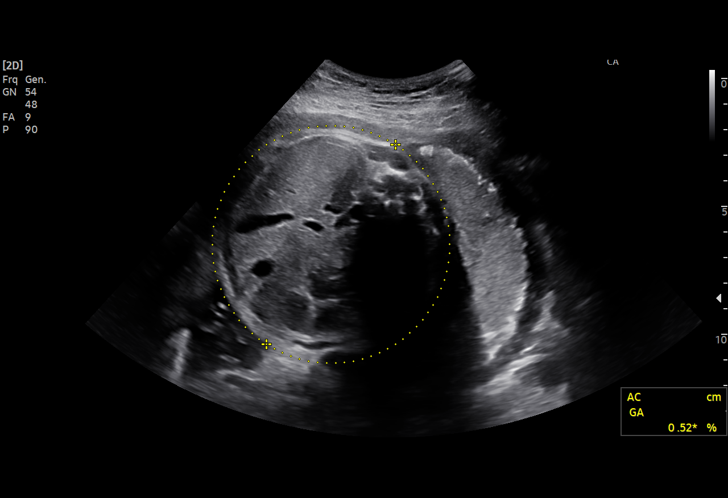
[im 24/47]
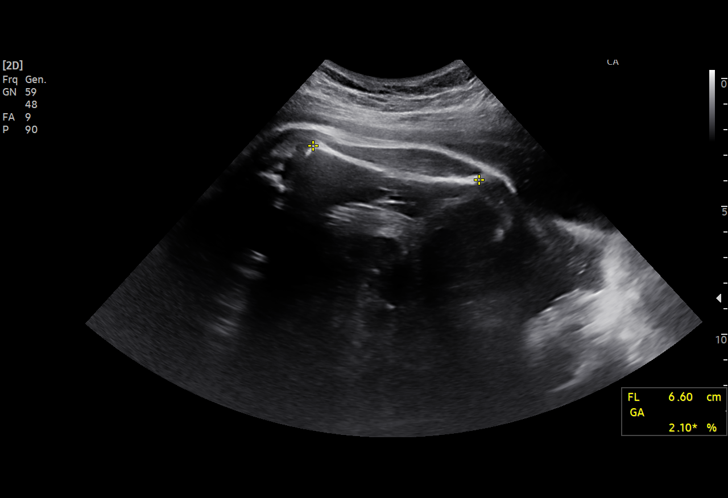
[im 26/47]
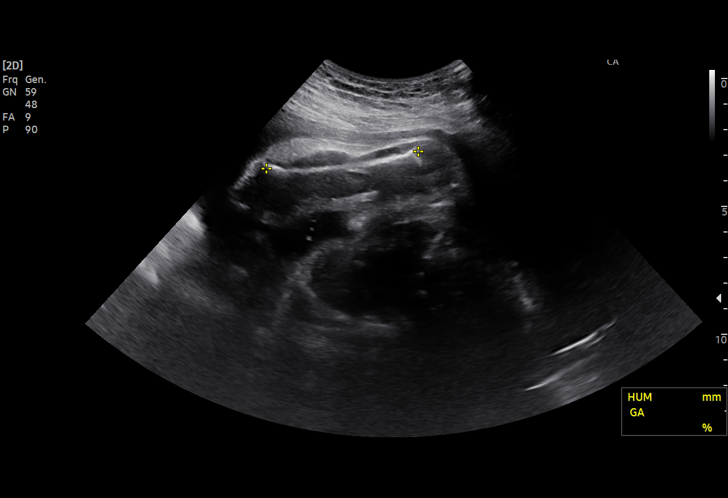
[im 29/47]
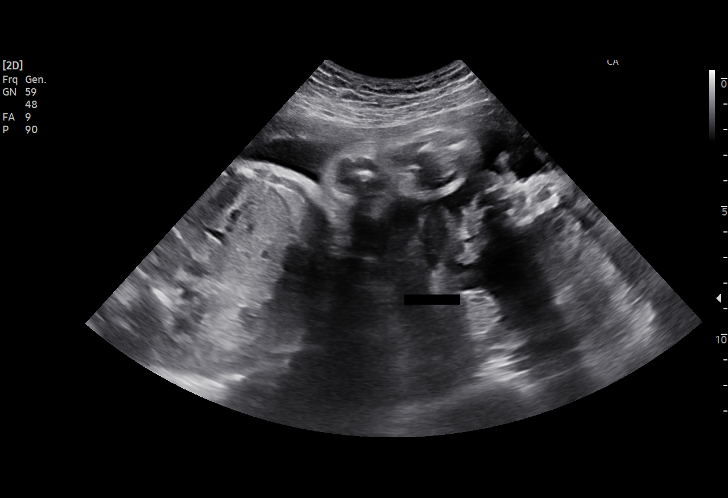
[im 33/47]
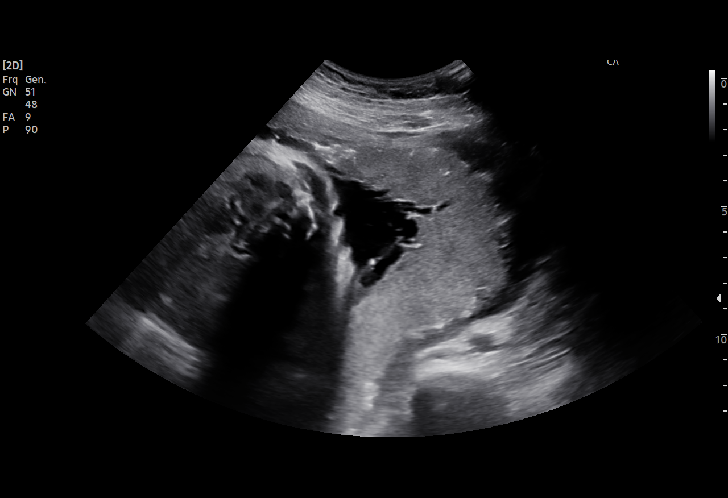
[im 36/47]
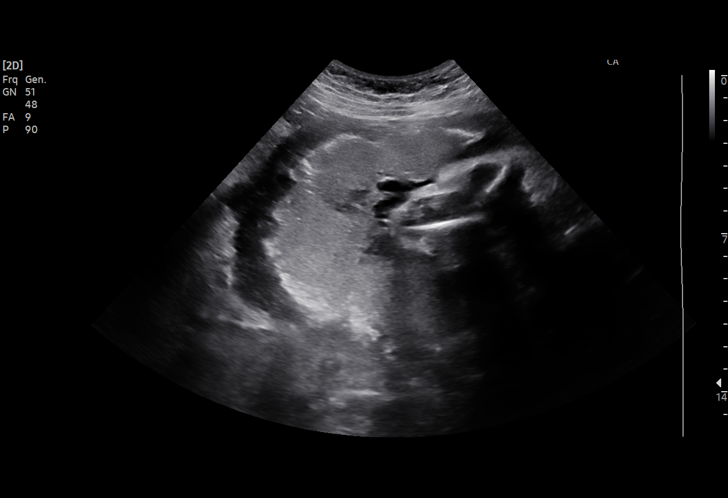
[im 40/47]
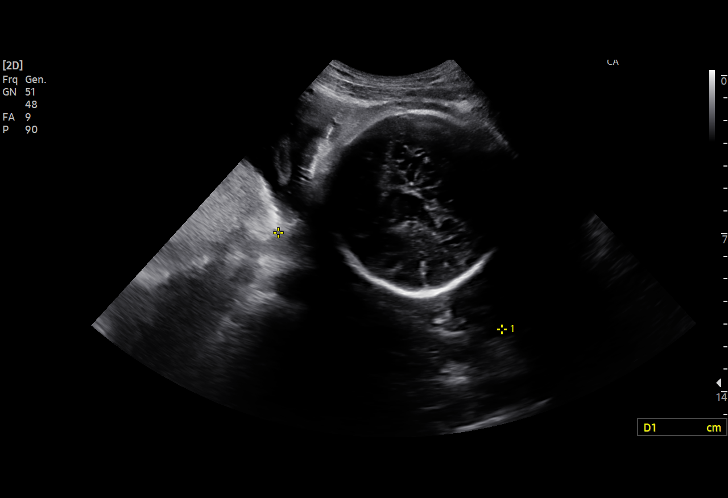
[im 43/47]
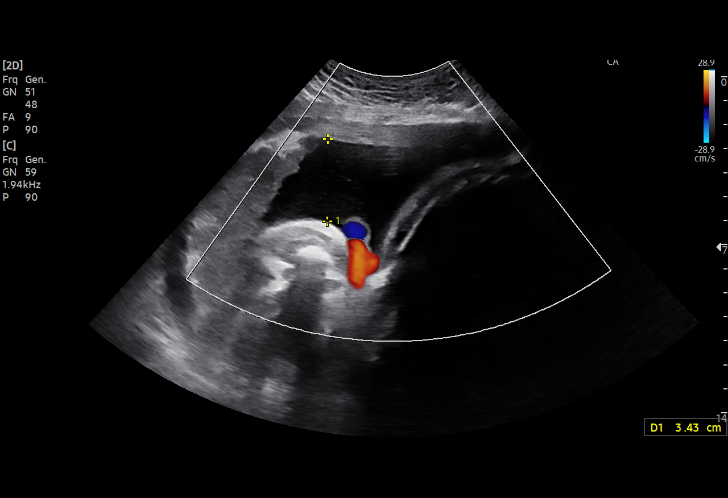
[im 47/47]
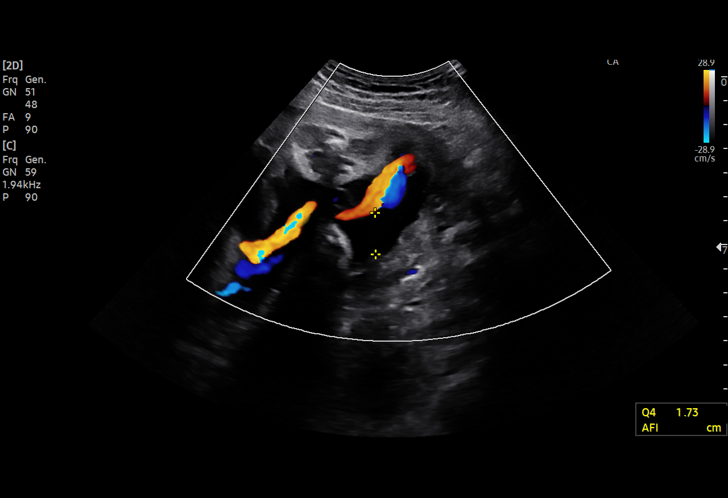

[15 of 28 positions shown; findings below may reference images not displayed]

FINDINGS: Number of Fetuses: 1

Heart Rate:  136 bpm

Movement: Yes

Presentation: Cephalic

Previa: No

Placental Location: Posterior and left lateral

Amniotic Fluid (Subjective): Within normal limits

Amniotic Fluid (Objective):

Vertical pocket 4.0cm

AFI 12.3 cm

FETAL BIOMETRY

BPD:  8.4cm 33w 6d

HC:    31.4cm 35w 2d

AC:    29.1cm 33w 1d

FL:    6.6cm 34w 0d

Current Mean GA: 34w 1d US EDC: 08/30/2021

Assigned GA: 36w 6d Assigned EDC: 08/11/2021

Estimated Fetal Weight:  2,246g 8%ile

FETAL ANATOMY

Lateral Ventricles: Appears normal

Thalami/CSP: Appears normal

Posterior Fossa: Previously seen

Nuchal Region: Previously seen

Upper Lip: Appears normal

Spine: Appears normal

4 Chamber Heart on Left: Appears normal

LVOT: Previously seen

RVOT: Appears normal

Stomach on Left: Appears normal

3 Vessel Cord: Previously seen

Cord Insertion site: Previously seen

Kidneys: Appears normal

Bladder: Appears normal

Extremities: Previously seen

Sex: Previously Seen

Technical Limitations: Advanced gestational age and fetal position

Maternal Findings:

Cervix:  Not evaluated (>34 wks)
IMPRESSION: Assigned GA currently 36 weeks 6 days. Fetus measures small for
gestational age, with EFW currently at 8 %ile.

Amniotic fluid volume within normal limits, with AFI of 12.3 cm.

## 2022-12-26 ENCOUNTER — Emergency Department
Admission: EM | Admit: 2022-12-26 | Discharge: 2022-12-26 | Disposition: A | Discharge status: Home / Self Care | Payer: Medicaid Other | Attending: Student in an Organized Health Care Education/Training Program | Admitting: Student in an Organized Health Care Education/Training Program

## 2022-12-26 ENCOUNTER — Other Ambulatory Visit: Payer: Self-pay

## 2022-12-26 ENCOUNTER — Encounter: Payer: Self-pay | Admitting: Emergency Medicine

## 2022-12-26 DIAGNOSIS — R5383 Other fatigue: Secondary | ICD-10-CM | POA: Diagnosis not present

## 2022-12-26 DIAGNOSIS — O219 Vomiting of pregnancy, unspecified: Secondary | ICD-10-CM

## 2022-12-26 DIAGNOSIS — Z3A01 Less than 8 weeks gestation of pregnancy: Secondary | ICD-10-CM | POA: Diagnosis not present

## 2022-12-26 LAB — CBC
HCT: 33.1 % — ABNORMAL LOW (ref 36.0–46.0)
Hemoglobin: 10.8 g/dL — ABNORMAL LOW (ref 12.0–15.0)
MCH: 26.5 pg (ref 26.0–34.0)
MCHC: 32.6 g/dL (ref 30.0–36.0)
MCV: 81.3 fL (ref 80.0–100.0)
Platelets: 321 10*3/uL (ref 150–400)
RBC: 4.07 MIL/uL (ref 3.87–5.11)
RDW: 15.7 % — ABNORMAL HIGH (ref 11.5–15.5)
WBC: 11 10*3/uL — ABNORMAL HIGH (ref 4.0–10.5)
nRBC: 0 % (ref 0.0–0.2)

## 2022-12-26 LAB — HEPATIC FUNCTION PANEL
ALT: 8 U/L (ref 0–44)
AST: 15 U/L (ref 15–41)
Albumin: 3.5 g/dL (ref 3.5–5.0)
Alkaline Phosphatase: 43 U/L (ref 38–126)
Bilirubin, Direct: <0.1 mg/dL (ref 0.0–0.2)
Total Bilirubin: 0.4 mg/dL (ref 0.3–1.2)
Total Protein: 6.9 g/dL (ref 6.5–8.1)

## 2022-12-26 LAB — URINALYSIS, ROUTINE W REFLEX MICROSCOPIC
Bilirubin Urine: NEGATIVE
Glucose, UA: NEGATIVE mg/dL
Hgb urine dipstick: NEGATIVE
Ketones, ur: NEGATIVE mg/dL
Leukocytes,Ua: NEGATIVE
Nitrite: NEGATIVE
Protein, ur: NEGATIVE mg/dL
Specific Gravity, Urine: 1.017 (ref 1.005–1.030)
pH: 6 (ref 5.0–8.0)

## 2022-12-26 LAB — POC URINE PREG, ED: Preg Test, Ur: POSITIVE — AB

## 2022-12-26 LAB — BASIC METABOLIC PANEL
Anion gap: 7 (ref 5–15)
BUN: 8 mg/dL (ref 6–20)
CO2: 22 mmol/L (ref 22–32)
Calcium: 8.4 mg/dL — ABNORMAL LOW (ref 8.9–10.3)
Chloride: 105 mmol/L (ref 98–111)
Creatinine, Ser: 0.79 mg/dL (ref 0.44–1.00)
GFR, Estimated: >60 mL/min (ref >60)
Glucose, Bld: 92 mg/dL (ref 70–99)
Potassium: 3.8 mmol/L (ref 3.5–5.1)
Sodium: 134 mmol/L — ABNORMAL LOW (ref 135–145)

## 2022-12-26 LAB — LIPASE, BLOOD: Lipase: 32 U/L (ref 11–51)

## 2022-12-26 LAB — HCG, QUANTITATIVE, PREGNANCY: hCG, Beta Chain, Quant, S: 83169 m[IU]/mL — ABNORMAL HIGH (ref <5)

## 2022-12-26 MED ORDER — ONDANSETRON 4 MG PO TBDP: 4.0000 mg | ORAL_TABLET | Freq: Three times a day (TID) | ORAL | 0 refills | Status: AC | PRN

## 2022-12-26 MED ORDER — SODIUM CHLORIDE 0.9 % IV BOLUS
1000.0000 mL | Freq: Once | INTRAVENOUS | Status: AC
  Administered 2022-12-26: 1000 mL via INTRAVENOUS

## 2022-12-26 MED ORDER — ONDANSETRON HCL 4 MG/2ML IJ SOLN
4.0000 mg | Freq: Once | INTRAMUSCULAR | Status: AC
Start: 2022-12-26 — End: 2022-12-26
  Administered 2022-12-26: 4 mg via INTRAVENOUS
  Filled 2022-12-26: qty 2

## 2022-12-26 NOTE — ED Triage Notes (Signed)
Pt via POV from home. Pt c/o emesis for past couple of days. States that she also been having abd pain but denies vaginal bleeding. States she think she is pregnant unknown how many weeks and pregnancy has not been confirmed by a physician. LMP 4/10. Pt is A&Ox4 and NAD

## 2022-12-26 NOTE — ED Notes (Signed)
Pt verbalizes understanding of discharge instructions. Opportunity for questioning and answers were provided. Pt discharged from ED to home with significant other.   ? ?

## 2022-12-26 NOTE — ED Provider Notes (Signed)
[facility] Provider Note    Event Date/Time   First MD Initiated Contact with Patient 12/26/22 [phone]     (approximate)   History   Emesis During Pregnancy   HPI  Emily Levy is a 32 y.o. female  G3P1011 who presents to the ER for evaluation of nausea vomiting feeling fatigued in the setting of roughly 6-week pregnancy by LMP.  She denies any cramping or vaginal bleeding.  Has had sickness with previous pregnancies.  Denies any dysuria no fevers or chills.  Having trouble keeping any fluids food down.  Previously had control symptoms with Zofran and Phenergan during previous pregnancies.     Physical Exam   Triage Vital Signs: ED Triage Vitals  Enc Vitals Group     BP 12/26/22 0924 114/67     Pulse Rate 12/26/22 0924 65     Resp 12/26/22 0924 16     Temp 12/26/22 0924 98.1 F (36.7 C)     Temp Source 12/26/22 0924 Oral     SpO2 12/26/22 0924 95 %     Weight 12/26/22 0920 196 lb (88.9 kg)     Height 12/26/22 0920 5\' 5"  (1.651 m)     Head Circumference --      Peak Flow --      Pain Score 12/26/22 0920 7     Pain Loc --      Pain Edu? --      Excl. in GC? --     Most recent vital signs: Vitals:   12/26/22 0924  BP: 114/67  Pulse: 65  Resp: 16  Temp: 98.1 F (36.7 C)  SpO2: 95%     Constitutional: Alert  Eyes: Conjunctivae are normal.  Head: Atraumatic. Nose: No congestion/rhinnorhea. Mouth/Throat: Mucous membranes are moist.   Neck: Painless ROM.  Cardiovascular:   Good peripheral circulation. Respiratory: Normal respiratory effort.  No retractions.  Gastrointestinal: Soft and nontender.  Musculoskeletal:  no deformity Neurologic:  MAE spontaneously. No gross focal neurologic deficits are appreciated.  Skin:  Skin is warm, dry and intact. No rash noted. Psychiatric: Mood and affect are normal. Speech and behavior are normal.    ED Results / Procedures / Treatments   Labs (all labs ordered are listed, but only  abnormal results are displayed) Labs Reviewed  CBC - Abnormal; Notable for the following components:      Result Value   WBC 11.0 (*)    Hemoglobin 10.8 (*)    HCT 33.1 (*)    RDW 15.7 (*)    All other components within normal limits  BASIC METABOLIC PANEL - Abnormal; Notable for the following components:   Sodium 134 (*)    Calcium 8.4 (*)    All other components within normal limits  HCG, QUANTITATIVE, PREGNANCY - Abnormal; Notable for the following components:   hCG, Beta Chain, Quant, S 83,169 (*)    All other components within normal limits  URINALYSIS, ROUTINE W REFLEX MICROSCOPIC - Abnormal; Notable for the following components:   Color, Urine YELLOW (*)    APPearance HAZY (*)    All other components within normal limits  POC URINE PREG, ED - Abnormal; Notable for the following components:   Preg Test, Ur Positive (*)    All other components within normal limits  HEPATIC FUNCTION PANEL  LIPASE, BLOOD     EKG     RADIOLOGY    PROCEDURES:  Critical Care performed:   Procedures   MEDICATIONS ORDERED  IN ED: Medications  sodium chloride 0.9 % bolus 1,000 mL (1,000 mLs Intravenous New Bag/Given 12/26/22 1027)  ondansetron (ZOFRAN) injection 4 mg (4 mg Intravenous Given 12/26/22 1028)     IMPRESSION / MDM / ASSESSMENT AND PLAN / ED COURSE  I reviewed the triage vital signs and the nursing notes.                              Differential diagnosis includes, but is not limited to, enteritis, gastritis, morning sickness, hyperemesis, electrolyte abnormality  Patient presenting to the ER for evaluation of symptoms as described above.  Based on symptoms, risk factors and considered above differential, this presenting complaint could reflect a potentially life-threatening illness therefore the patient will be placed on continuous pulse oximetry and telemetry for monitoring.  Laboratory evaluation will be sent to evaluate for the above complaints.  Patient  well-appearing in no acute distress.  This is not consistent with ectopic.  More likely morning sickness.  Will give IV fluids will check urine.  Do not feel that imaging clinically indicated.   Clinical Course as of 12/26/22 1253  Wed Dec 26, 2022  1106 Patient feeling significantly removed.  Tolerating p.o.  Up ambulating to the bathroom. [PR]  1229 Patient reassessed.  Remains well-appearing and in no acute distress and eating p.o.  Does appear stable for outpatient follow-up [PR]    Clinical Course User Index [PR] Willy Eddy, MD     FINAL CLINICAL IMPRESSION(S) / ED DIAGNOSES   Final diagnoses:  Nausea/vomiting in pregnancy     Rx / DC Orders   ED Discharge Orders          Ordered    ondansetron (ZOFRAN-ODT) 4 MG disintegrating tablet  Every 8 hours PRN        12/26/22 1108             Note:  This document was prepared using Dragon voice recognition software and may include unintentional dictation errors.    Willy Eddy, MD 12/26/22 1253

## 2022-12-26 NOTE — ED Notes (Signed)
Bolus still running a this time. Family at bedside. Pt alert with no complaints
